# Patient Record
Sex: Female | Born: 1965 | Race: Black or African American | Hispanic: No | Marital: Single | Smoking: Never smoker
Health system: Southern US, Community
[De-identification: ages and names within clinical notes are randomized; demographics above are authoritative.]

## PROBLEM LIST (undated history)

## (undated) DIAGNOSIS — I10 Essential (primary) hypertension: Secondary | ICD-10-CM

## (undated) DIAGNOSIS — F32A Depression, unspecified: Secondary | ICD-10-CM

## (undated) DIAGNOSIS — E0590 Thyrotoxicosis, unspecified without thyrotoxic crisis or storm: Secondary | ICD-10-CM

## (undated) DIAGNOSIS — F419 Anxiety disorder, unspecified: Secondary | ICD-10-CM

## (undated) NOTE — Unmapped External Note (Signed)
 Formatting of this note might be different from the original. C/o left breast swelling , red and painful. No discharge. Here last night but left because it was too busy. Started last Monday. Electronically signed by Inocente Champ, RN at 07/02/2023  5:54 AM PDT

## (undated) NOTE — ED Provider Notes (Signed)
 Formatting of this note is different from the original. Images from the original note were not included. Emergency Department Encounter Name:   Shelley Cunningham MRN:   [id number]  DOB:   1965/02/13 Acct:   [id number]  Age:   70 y.o. Arrival:   07/02/23 at 0833  Sex:   female ED Provider:   Asberry LITTIE Greet, NP  PCP:  PCP, LENNOX, MD Location:   [facility] EMERGENCY   Chief Complaint Chief Complaint  Patient presents with   Breast Pain   HPI 42 y.o. female presents for left breast pain, redness, and swelling. The patient reports that the pain started on Monday, with the breast becoming increasingly red and swollen. She describes the pain as severe, interfering with her ability to sleep. The patient also reports experiencing fevers and chills associated with this condition.  The patient states that she has not had a mammogram recently, acknowledging that it's time for one. She denies being a current or former smoker. Ms. Lybrand reports a medical history significant for hypertension, asthma, and hypothyroidism.  Review of Systems General: Positive for fever, chills Breast: Left breast pain, redness, and swelling Respiratory: History of asthma Cardiovascular: History of hypertension Endocrine: History of hypothyroidism  Code Status:   Reviewed with patient and/or family as FULL  ROS Review of Systems   PHYSICAL EXAM BP 119/64   Pulse 83   Temp 98.4 F (36.9 C) (Oral)   Resp 18   Ht 5' 4 (1.626 m)   Wt 74.8 kg (165 lb)   SpO2 95%   BMI 28.32 kg/m  Vitals:   07/02/23 0855 07/02/23 0856 07/02/23 0857  BP:   119/64  Pulse:  83   Resp:  18   Temp:  98.4 F (36.9 C)   TempSrc:  Oral   SpO2:  95%   Weight: 74.8 kg (165 lb)    Height: 5' 4 (1.626 m)     Physical Exam Vitals reviewed.  Constitutional:      General: She is not in acute distress.    Appearance: Normal appearance. She is normal weight. She is not toxic-appearing.  HENT:     Mouth/Throat:      Mouth: Mucous membranes are moist.     Pharynx: Oropharynx is clear.  Eyes:     Extraocular Movements: Extraocular movements intact.     Conjunctiva/sclera: Conjunctivae normal.     Pupils: Pupils are equal, round, and reactive to light.  Cardiovascular:     Rate and Rhythm: Normal rate and regular rhythm.     Pulses: Normal pulses.     Heart sounds: Normal heart sounds.  Pulmonary:     Effort: Pulmonary effort is normal. No respiratory distress.     Breath sounds: Normal breath sounds. No wheezing.  Chest:   Abdominal:     General: Bowel sounds are normal.     Palpations: Abdomen is soft.  Musculoskeletal:        General: No swelling or tenderness. Normal range of motion.  Skin:    General: Skin is warm and dry.     Capillary Refill: Capillary refill takes less than 2 seconds.  Neurological:     General: No focal deficit present.     Mental Status: She is alert and oriented to person, place, and time. Mental status is at baseline.     Cranial Nerves: No cranial nerve deficit.     Motor: No weakness.  Psychiatric:        Mood and  Affect: Mood normal.        Behavior: Behavior normal.        Thought Content: Thought content normal.        Judgment: Judgment normal.   PAST MEDICAL HISTORY Past Medical History:  Diagnosis Date   Asthma    Hypertension    Hypothyroid    SURGICAL HISTORY No past surgical history on file.  CURRENT MEDICATIONS  Discharge Medication List as of 07/02/2023 10:54 AM    CONTINUE these medications which have NOT CHANGED   Details  methylPREDNISolone (MEDROL, PAK,) 4 MG tablet follow taper directions on package, Print     ALLERGIES Allergies  Allergen Reactions   Flagyl [Metronidazole]    FAMILY HISTORY No family history on file.  SOCIAL HISTORY Social History   Socioeconomic History   Marital status: Single    Spouse name: Not on file   Number of children: Not on file   Years of education: Not on file   Highest education  level: Not on file  Occupational History   Not on file  Tobacco Use   Smoking status: Not on file   Smokeless tobacco: Not on file  Substance and Sexual Activity   Alcohol use: Not on file   Drug use: Not on file   Sexual activity: Not on file  Other Topics Concern   Not on file  Social History Narrative   Not on file   Social Determinants of Health   Financial Resource Strain: Not on file  Food Insecurity: Not on file  Transportation Needs: Not on file  Physical Activity: Not on file  Stress: Not on file  Social Connections: Not on file  Intimate Partner Violence: Not on file  Housing Stability: Not on file   Labs Labs Reviewed - No data to display  Radiology Imaging Results   None   ------------------------------------------------------------------------------------------- MEDICAL DECISION MAKING (MDM) AND ED COURSE  Number and Complexity of Problems = 2 or more with high complexity -Differential Diagnosis: Mastitis, cellulitis, abscess  -Patient's chronic illnesses impacting care: See PMHx, PSHx   Data -Diagnostic test ordered: No orders of the defined types were placed in this encounter.  -Diagnostic test review: (abnormal) consistent with diagnosis -My EKG interpretation: Not Applicable   -My Personal X-ray interpretation:  consistent with diagnosis -Decision rules/scores evaluated: Not Applicable -Diagnostic Tests considered but not ordered: None -External documents reviewed: Yes -History obtained from someone other than the patient: Yes -Discussed with: Patient & Family   Treatment and Disposition Medication  Reviewed : Yes  Medications Given in ED: Medications  sulfamethoxazole-trimethoprim (BACTRIM DS,SEPTRA DS) 800-160 MG per tablet 2 tablet (2 tablets Oral Given 07/02/23 1054)  HYDROcodone-acetaminophen  (NORCO) 5-325 MG per tablet 1 tablet (1 tablet Oral Given 07/02/23 1052)   Prescription Drug Management  Prescriptions: Discharge Medication List  as of 07/02/2023 10:54 AM    START taking these medications   Details  sulfamethoxazole-trimethoprim (BACTRIM DS,SEPTRA DS) 800-160 MG per tablet Take 1 tablet by mouth 2 (two) times a day for 10 days, Starting Wed 07/02/2023, Until Sat 07/12/2023, Normal   traMADol  (ULTRAM ) 50 MG tablet Take 1 tablet (50 mg total) by mouth daily for 10 days, Starting Wed 07/02/2023, Until Sat 07/12/2023, Normal     Social Determinants of Health that impact treatment or disposition: None  Shared decision making: N/A  ED Course: ?Ms. Widjaja, a female with hypertension, asthma, and hypothyroidism, presented with left breast pain, redness, and swelling that began Monday, accompanied by fever and  chills. Examination revealed an erythematous, swollen left breast. She was diagnosed with mastitis/breast cellulitis and prescribed Bactrim for infection and tramadol  for pain management with first dose administered in the ED. She was referred to a breast specialist and scheduled for a mammogram with follow-up to assess antibiotic response.   Please see MDM section and rest of the note for further information on patient assessment and treatment.  Nursing Notes: Reviewed and utilized the nursing notes.  Old Medical Records:  The patient?s available past medical records and past encounters were reviewed and utilized.   ------------------------------------------------------------------------------------------------------------------ FINAL IMPRESSION AND DISPOSITION  1. Cellulitis of chest wall    Plan/Orders Assosiated with Diagnosis  and ICD Codes    Diagnosis Name ICD-10-CM Plan/Orders Assosiated with Diagnosis  1. Cellulitis of chest wall  L03.313      DISPOSITION:  Discharge to home  Shared Decision-Making:  Treatment plan and disposition discussed with the patient/family, questions answered   PATIENT REFERRED TO:  Follow-up Information     [facility]. Schedule an appointment as soon  as possible for a visit in 1 week.   Contact information [address] [place]  [phone]  Hours: M-F 8:30-4:30 Closed Weekends      Sal VEAR Novak, MD. Schedule an appointment as soon as possible for a visit in 1 week.   Specialty: Obstetrics and Gynecology Contact information [address] [place] [address] [phone]          DISCHARGE MEDICATIONS: Discharge Medication List as of 07/02/2023 10:54 AM    START taking these medications   Details  sulfamethoxazole-trimethoprim (BACTRIM DS,SEPTRA DS) 800-160 MG per tablet Take 1 tablet by mouth 2 (two) times a day for 10 days, Starting Wed 07/02/2023, Until Sat 07/12/2023, Normal   traMADol  (ULTRAM ) 50 MG tablet Take 1 tablet (50 mg total) by mouth daily for 10 days, Starting Wed 07/02/2023, Until Sat 07/12/2023, Normal      Asberry LITTIE Greet, NP 07/03/23 1314  Electronically signed by Leisa JAYSON [place], MD at 07/03/2023 12:18 PM PDT  Associated attestation - [place] Leisa JAYSON, MD - 07/03/2023 12:18 PM PDT Formatting of this note might be different from the original. This  patient  was seen independently by the midlevel provider. I was available for consult however I was not involved in the decision making or the disposition of this patient.

---

## 2005-11-01 ENCOUNTER — Emergency Department (HOSPITAL_COMMUNITY): Admission: EM | Admit: 2005-11-01 | Discharge: 2005-11-01 | Payer: Self-pay | Admitting: Family Medicine

## 2005-11-05 ENCOUNTER — Ambulatory Visit: Payer: Self-pay | Admitting: *Deleted

## 2006-08-04 ENCOUNTER — Emergency Department (HOSPITAL_COMMUNITY): Admission: EM | Admit: 2006-08-04 | Discharge: 2006-08-04 | Payer: Self-pay | Admitting: Emergency Medicine
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
## 2006-11-24 ENCOUNTER — Emergency Department (HOSPITAL_COMMUNITY): Admission: EM | Admit: 2006-11-24 | Discharge: 2006-11-24 | Payer: Self-pay | Admitting: Emergency Medicine

## 2006-11-24 IMAGING — CR DG CHEST 2V
2 series · 2 of 2 positions shown · non-contrast
Comparison: None.

CLINICAL DATA: Chest pain.
 CHEST - 2 VIEW:

[w chest pa]
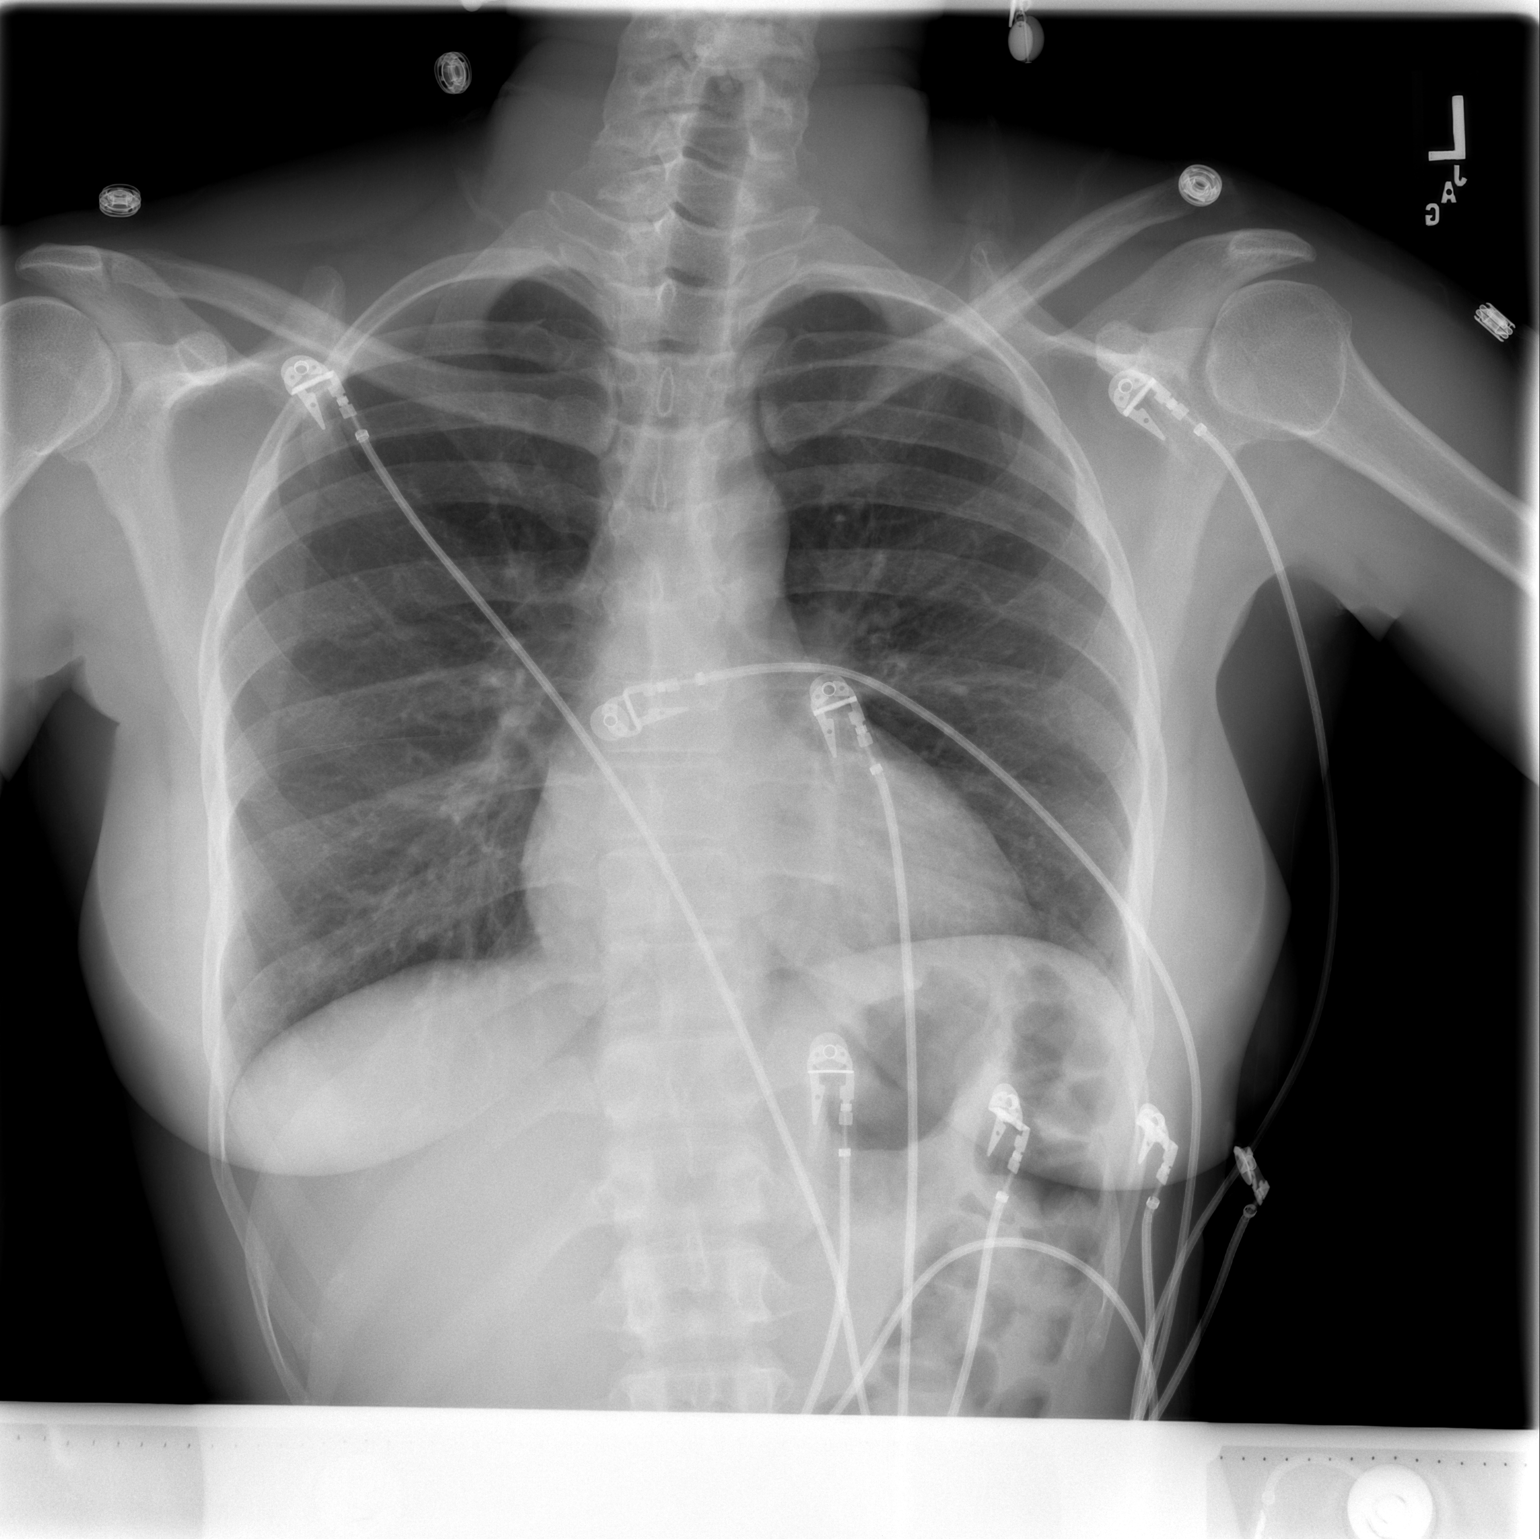

[w chest lat]
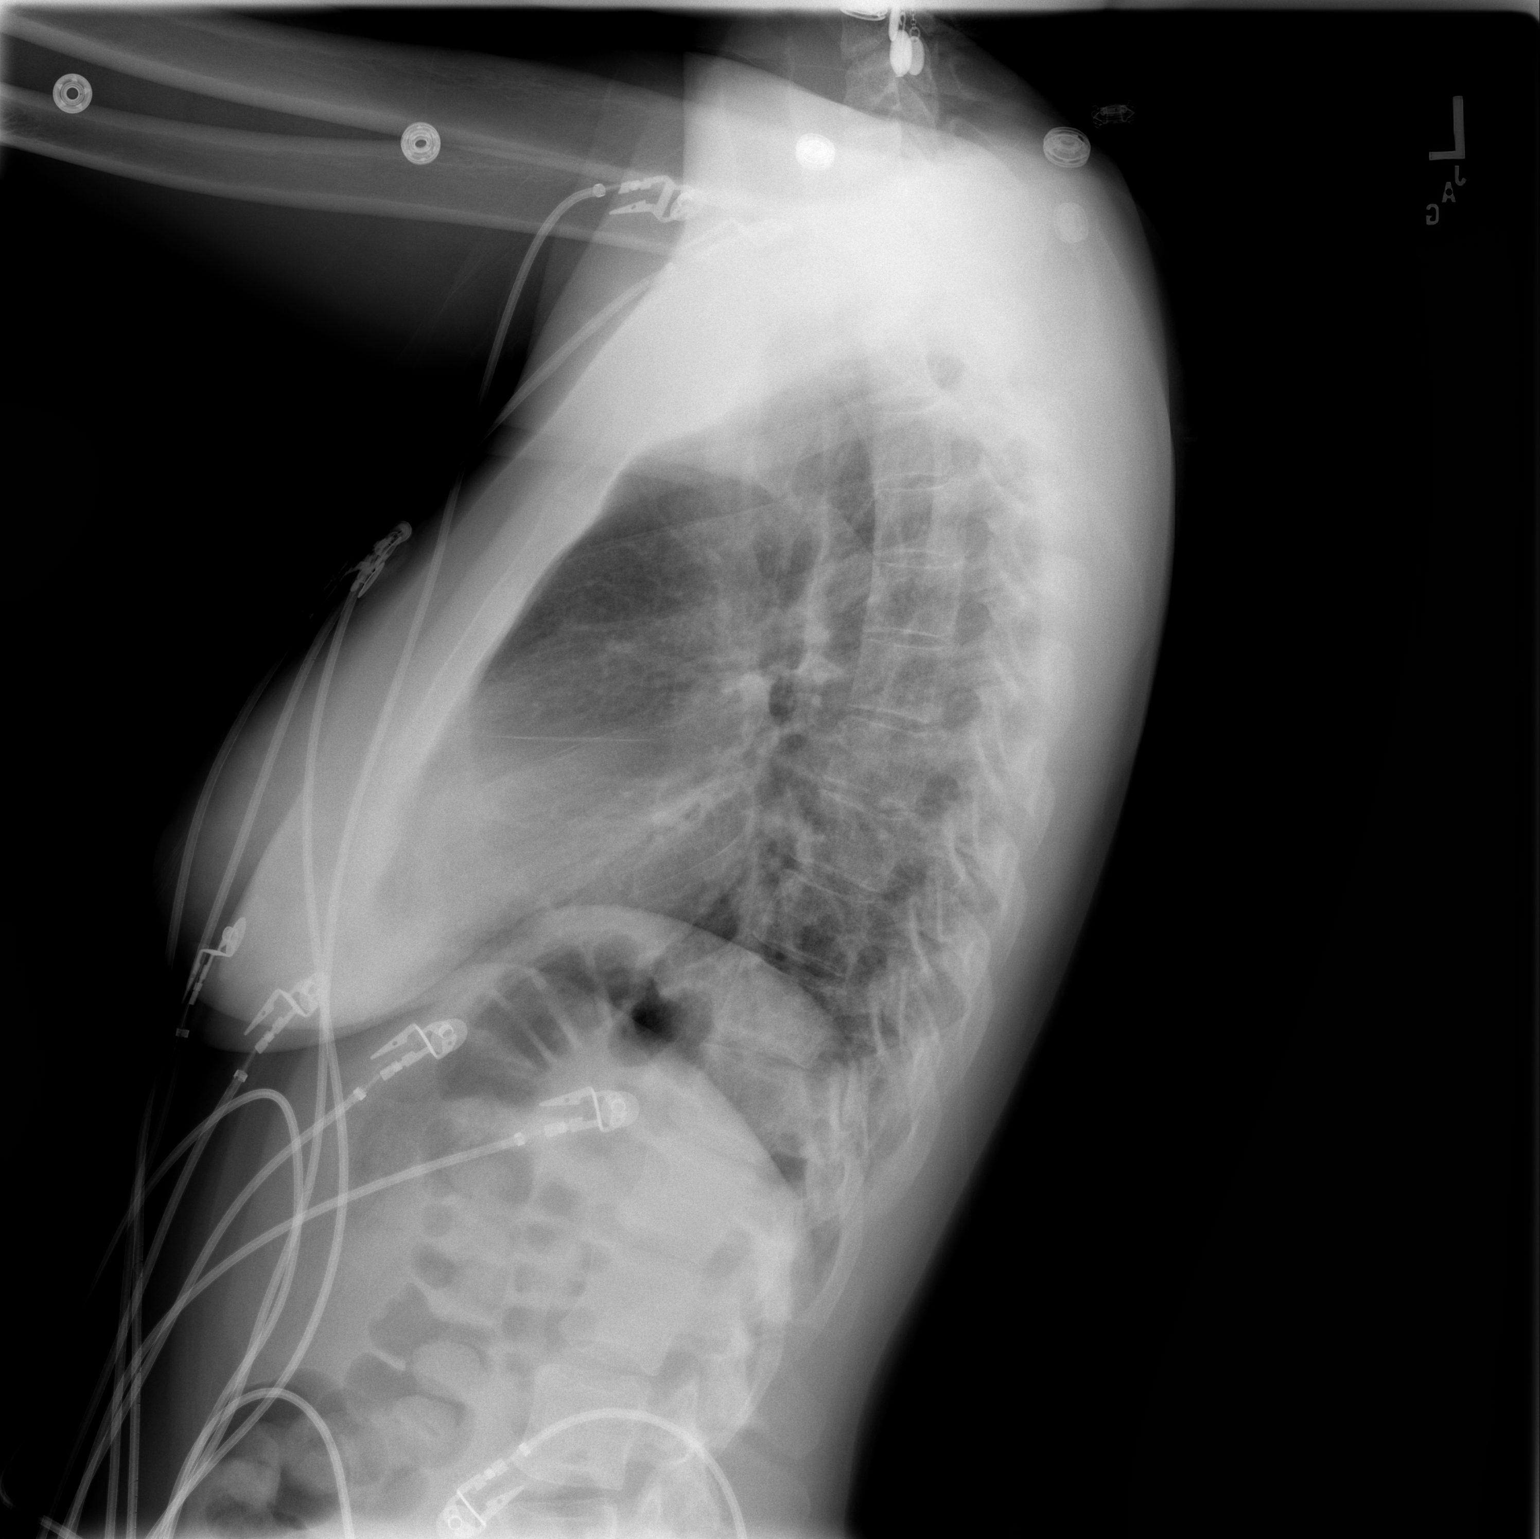

[2 of 2 positions shown; findings below may reference images not displayed]

FINDINGS: Lungs are clear.  No infiltrates, edema or pleural fluid.  No pneumothorax is seen.  The heart size is normal.  No pleural effusions.  The bony thorax is normal.
IMPRESSION: No acute findings.

## 2007-02-25 ENCOUNTER — Emergency Department (HOSPITAL_COMMUNITY): Admission: EM | Admit: 2007-02-25 | Discharge: 2007-02-25 | Payer: Self-pay | Admitting: Emergency Medicine

## 2007-02-25 IMAGING — CR DG CLAVICLE*L*
2 series · 2 of 2 positions shown · non-contrast
Comparison: [DATE].

CLINICAL DATA: LEFT CLAVICLE ? 2 VIEW:

[w clavicle ap left]
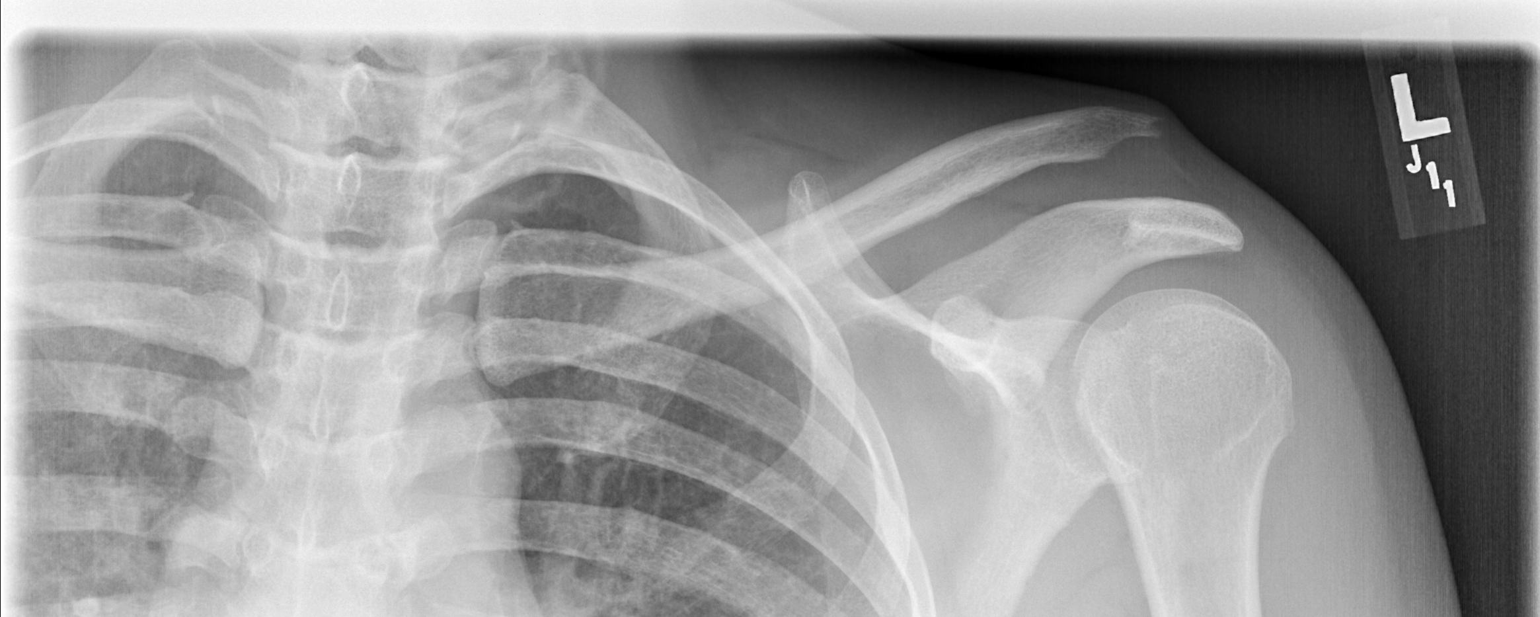

[w clavicle tangential left]
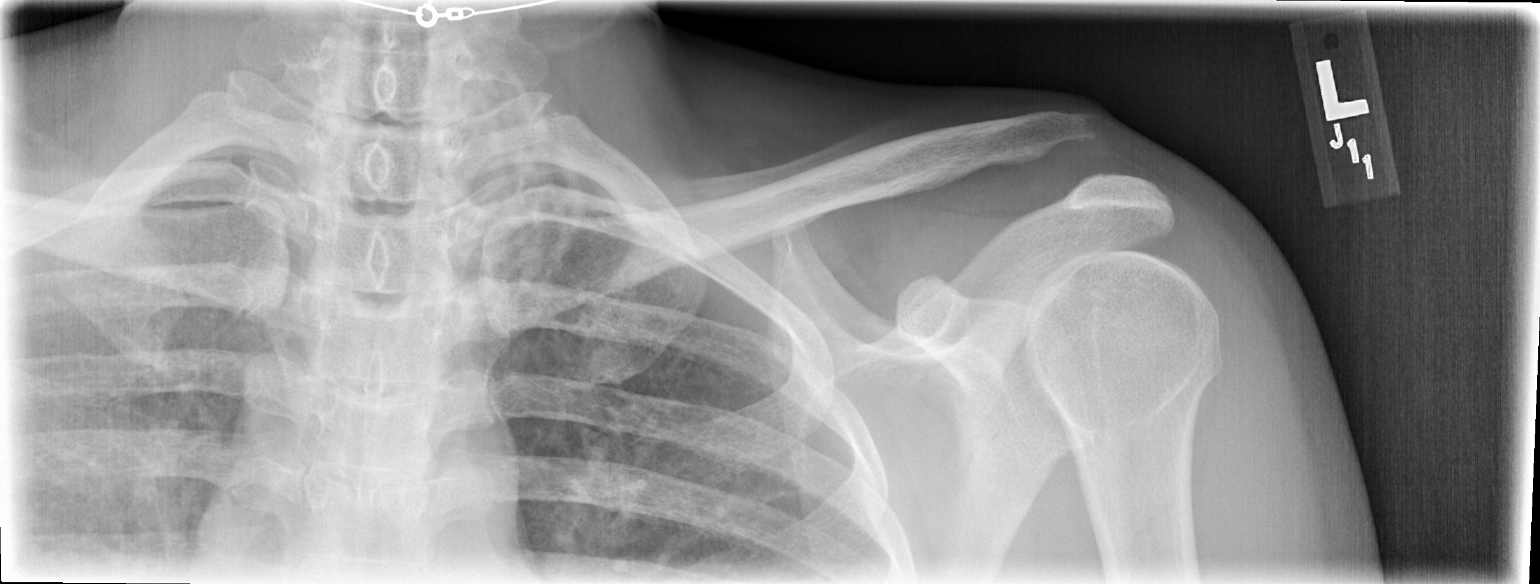

[2 of 2 positions shown; findings below may reference images not displayed]

FINDINGS: Two views of the left clavicle show no acute fracture.  There is chronic left acromioclavicular separation with cephalad position of distal clavicle.  There is volume loss/small osteolysis at the distal aspect of the left clavicle.   No acute fracture is seen.
IMPRESSION: No acute fracture is noted. There is chronic left acromioclavicular separation.  There is volume loss/osteolysis at the distal aspect of left clavicle.

## 2007-03-18 ENCOUNTER — Ambulatory Visit (HOSPITAL_BASED_OUTPATIENT_CLINIC_OR_DEPARTMENT_OTHER): Admission: RE | Admit: 2007-03-18 | Discharge: 2007-03-18 | Payer: Self-pay | Admitting: Orthopedic Surgery

## 2007-04-30 ENCOUNTER — Encounter: Admission: RE | Admit: 2007-04-30 | Discharge: 2007-07-29 | Payer: Self-pay | Admitting: Orthopedic Surgery

## 2007-06-14 ENCOUNTER — Emergency Department (HOSPITAL_COMMUNITY): Admission: EM | Admit: 2007-06-14 | Discharge: 2007-06-14 | Payer: Self-pay | Admitting: Emergency Medicine

## 2007-06-27 ENCOUNTER — Inpatient Hospital Stay (HOSPITAL_COMMUNITY): Admission: EM | Admit: 2007-06-27 | Discharge: 2007-07-01 | Payer: Self-pay | Admitting: Emergency Medicine

## 2007-06-27 IMAGING — CT CT PELVIS W/O CM
2 of 4 series · 16 of 46 positions shown, 18 images · non-contrast
Comparison: None

CT ABDOMEN

CLINICAL DATA: Hematuria with pain below the umbilicus.  Abdominal
cramps

CT ABDOMEN AND PELVIS WITHOUT CONTRAST
TECHNIQUE: Multidetector CT imaging of the abdomen and pelvis was
performed following the standard
protocol without intravenous contrast.

[Series 2: <(id) w/o a/p >(id) · axial · non-contrast · 0.65mm/px · z∈[-389,-49]mm · 13 of 74 slices shown, 15 images]
[im 3/74  soft-tissue]
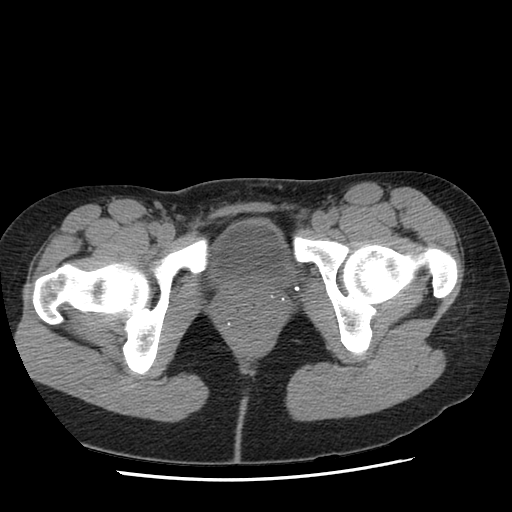
[im 3/74  bone]
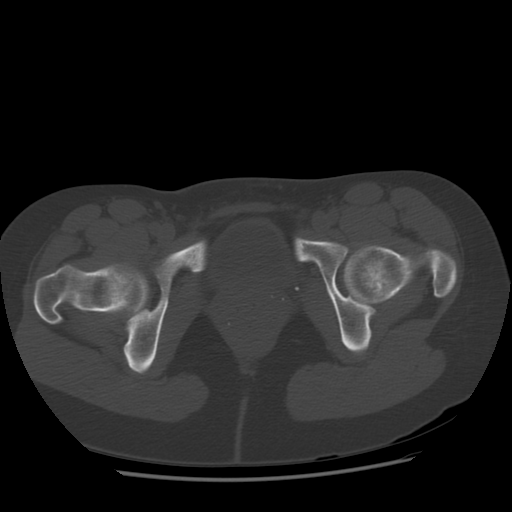
[im 9/74  soft-tissue]
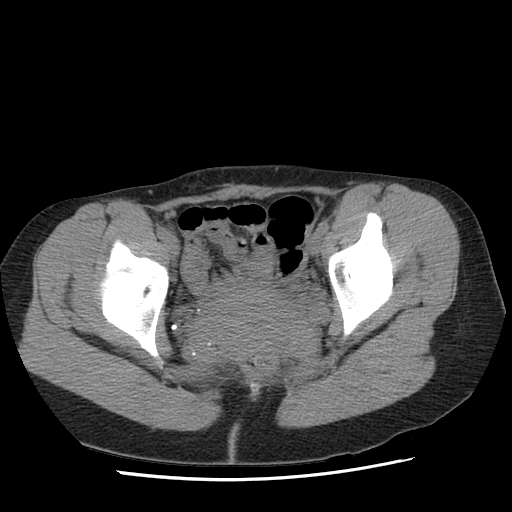
[im 15/74  soft-tissue]
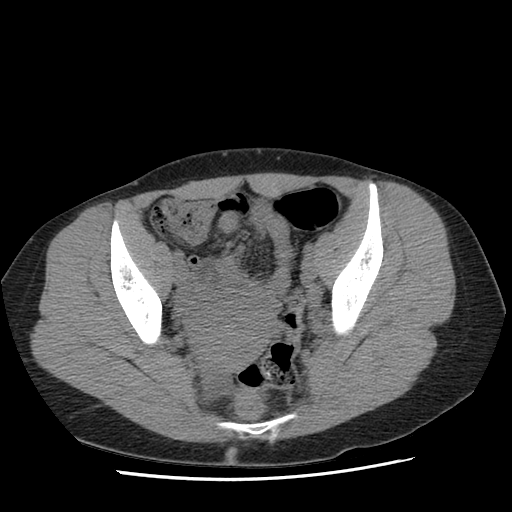
[im 21/74  soft-tissue]
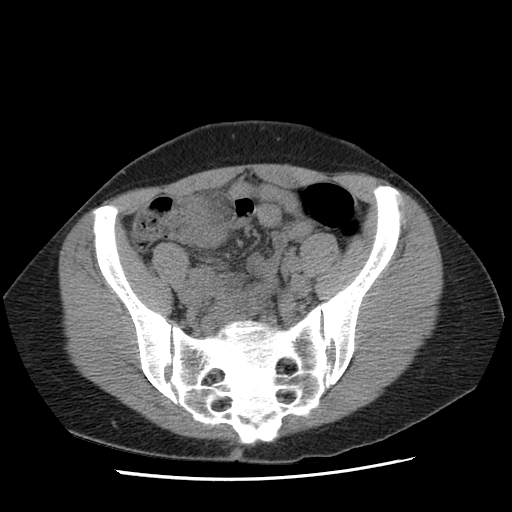
[im 27/74  soft-tissue]
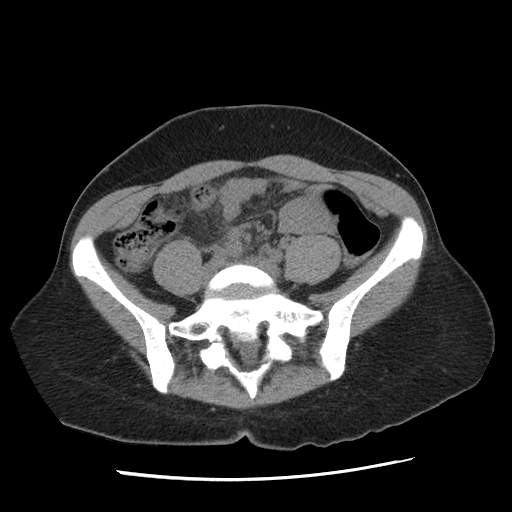
[im 33/74  soft-tissue]
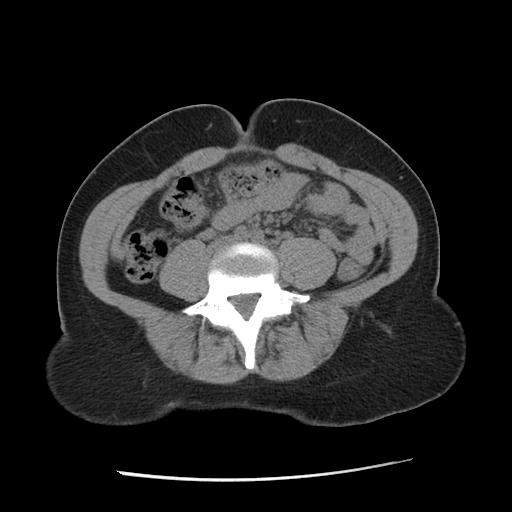
[im 38/74  soft-tissue]
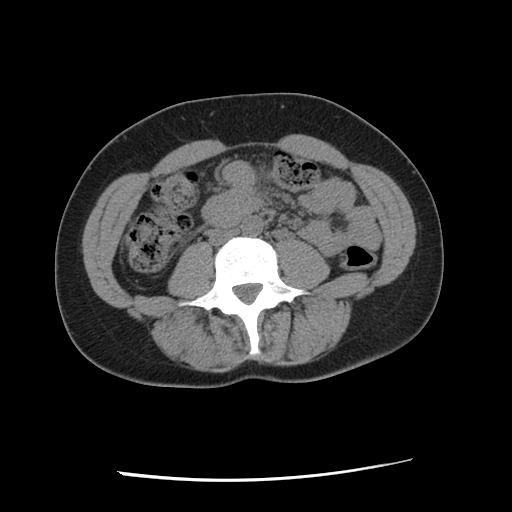
[im 41/74  soft-tissue]
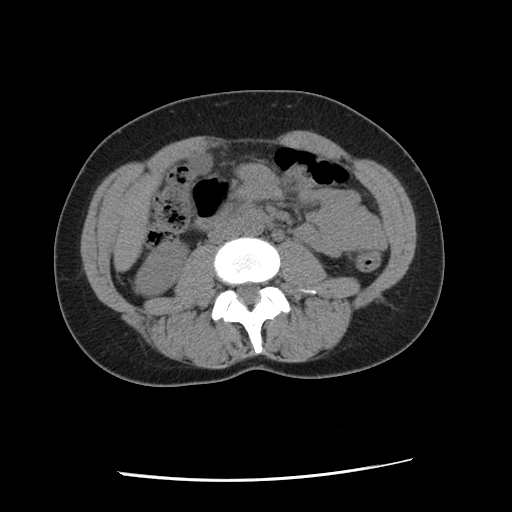
[im 47/74  soft-tissue]
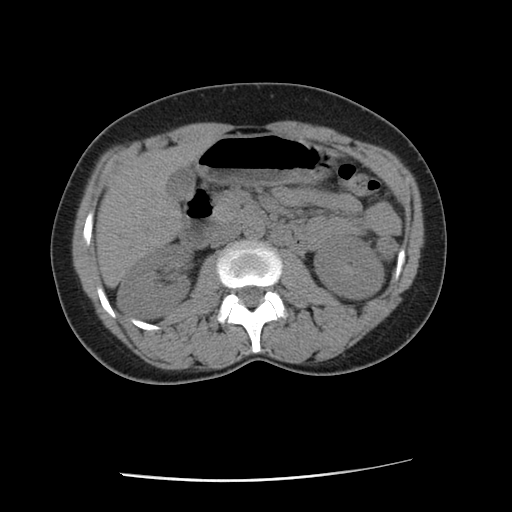
[im 47/74  bone]
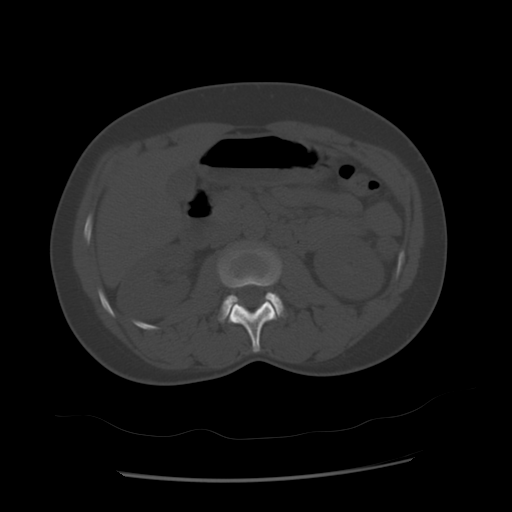
[im 53/74  soft-tissue]
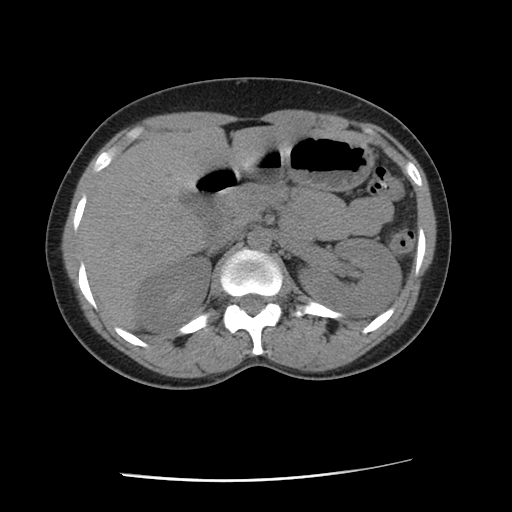
[im 59/74  soft-tissue]
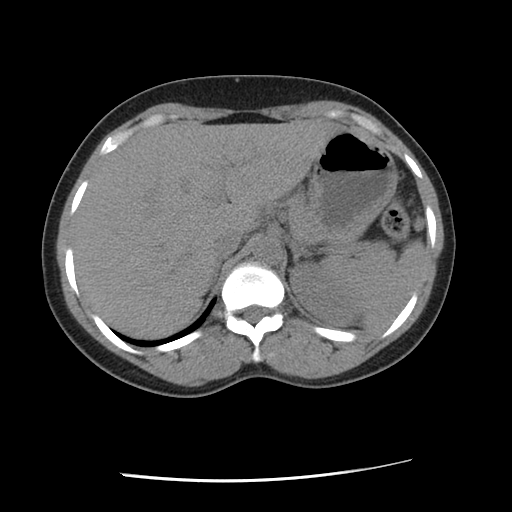
[im 65/74  soft-tissue]
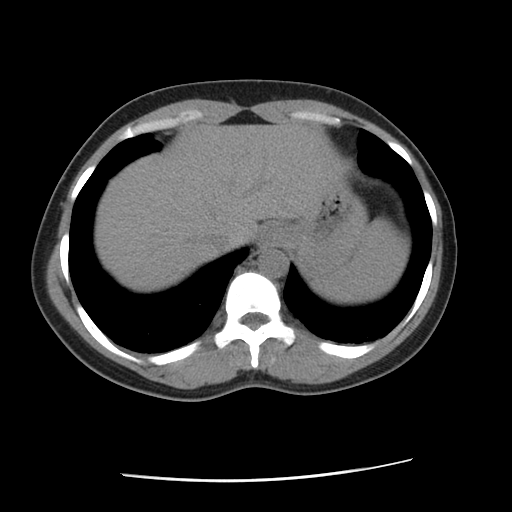
[im 71/74  soft-tissue]
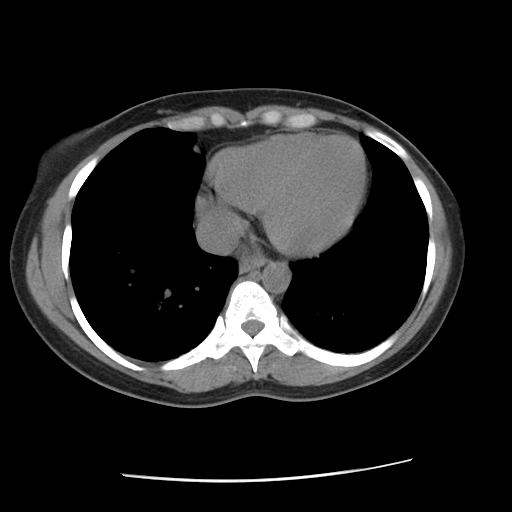

[Series 400: reformatted · coronal · 0.80mm/px · 3 of 65 slices shown]
[im 22/65  soft-tissue]
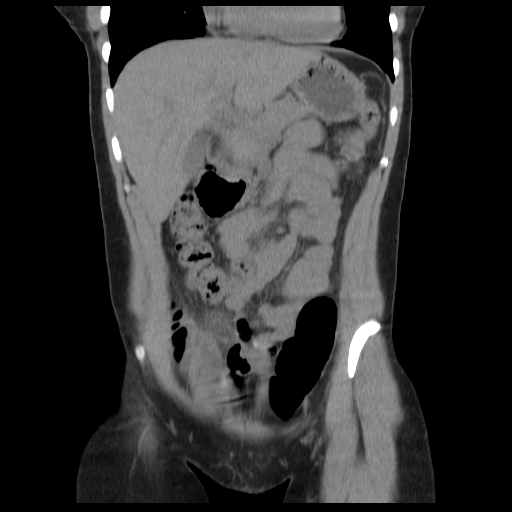
[im 29/65  soft-tissue]
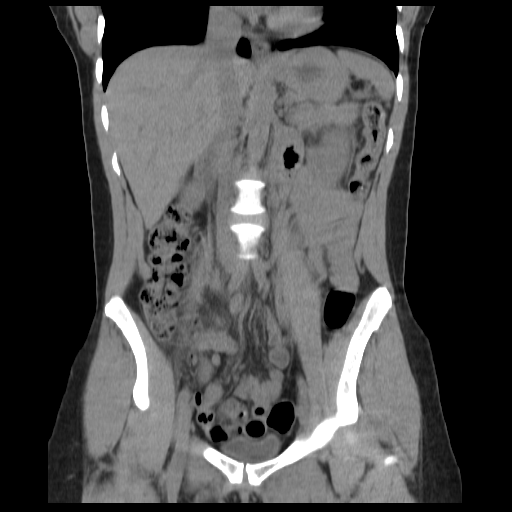
[im 36/65  soft-tissue]
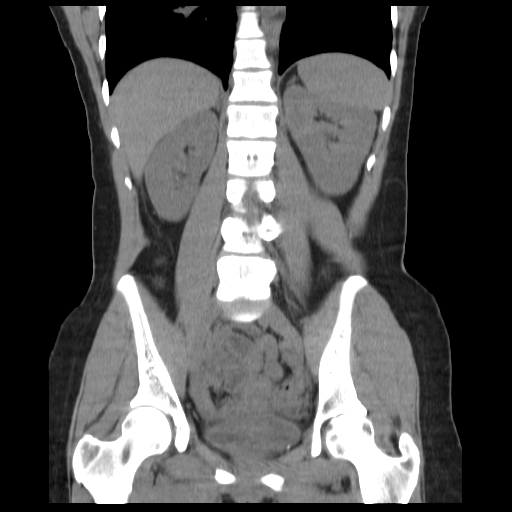

[16 of 46 positions shown; findings below may reference images not displayed]

FINDINGS: The liver and spleen have normal uninfused features.
The stomach is unremarkable.  The patient has a large duodenal
diverticulum.  The pancreas has normal uninfused features.  The
gallbladder and adrenal glands are unremarkable.  No stone is
identified in either kidney.  Some hyper attenuation in scattered
papillae of the kidneys may be related to concentrated urine.

There is no abdominal aortic aneurysm.  No abdominal
lymphadenopathy.  No intraperitoneal free fluid.
IMPRESSION: No acute findings in the abdomen.

CT PELVIS
FINDINGS: A small amount of intraperitoneal free fluid is evident.
The there is no pelvic sidewall lymphadenopathy.  Uterus is poorly
delineated.  Neither ovary is discretely identified. No stones are
seen in the bladder or either ureter.

There is an inflammatory process in the right lower quadrant which
is somewhat difficult to discern given the lack of oral and
intravenous contrast.  This appears to involve the cecum near the
ileocecal valve.  The appendix is normal with air in the lumen and
a diameter ranging from 5-7 mm.  The terminal ileum is not well
defined, but does track through or adjacent to the area of
inflammation.  Several tiny locules of extraluminal gas are seen in
the inflammatory change.
IMPRESSION: Inflammatory change with minimal extraluminal gas is identified in
the right lower quadrant, in the region of the cecum/ileocecal
valve.  The lack of oral and intravenous contrast hinders
assessment, but this does not appear to be related to the appendix.
Terminal ileitis with microperforation would be a consideration.
Right-sided diverticulitis with a trace amount of extraluminal gas
can also have this appearance.  PALACE diverticulitis and colonic
neoplasm cannot be completely excluded, but considered less likely
possibilities.  No focal or drainable fluid collection is evident
although assessment is limited by the noncontrast nature of this
exam.

Critical test results telephoned by me to Dr. PALACE at the time of
interpretation on date [DATE] at time [DATE] a.m..

## 2007-06-27 IMAGING — CT CT ABDOMEN W/ CM
1 of 5 series · 13 of 36 positions shown, 18 images · IV contrast (agent unspecified)
Comparison: Unenhanced CT same day

CLINICAL DATA: Abdominal pain

CT ABDOMEN WITH CONTRAST,CT PELVIS WITH CONTRAST
TECHNIQUE: Multidetector CT imaging of the abdomen was performed
following the standard protocol during bolus administration of
intravenous contrast.,Technique:  Multidetector CT imaging of the
pelvis was performed following the standard protocol during
Contrast: 100 ml of Optiray 350

[Series 2: routine abdomen · axial · 0.65mm/px · z∈[-397,-37]mm · 13 of 80 slices shown, 18 images]
[im 4/80  soft-tissue]
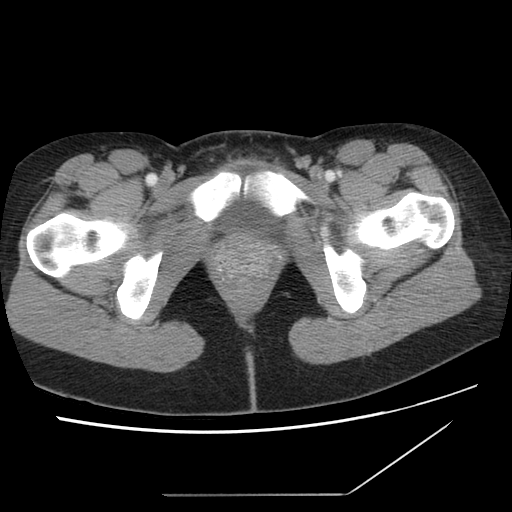
[im 4/80  bone]
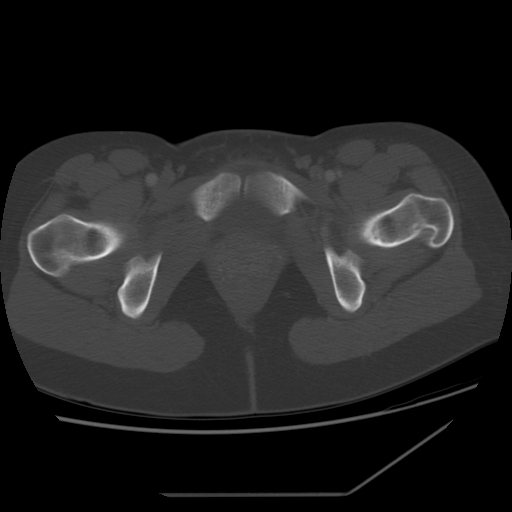
[im 12/80  soft-tissue]
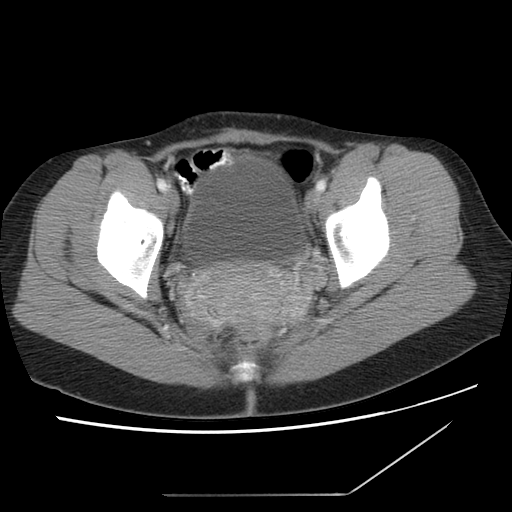
[im 16/80  soft-tissue]
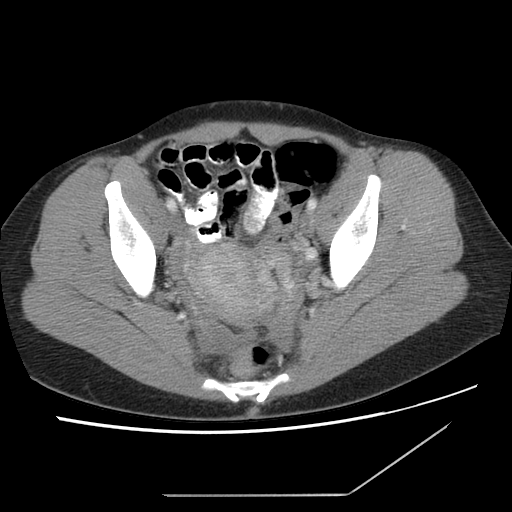
[im 24/80  soft-tissue]
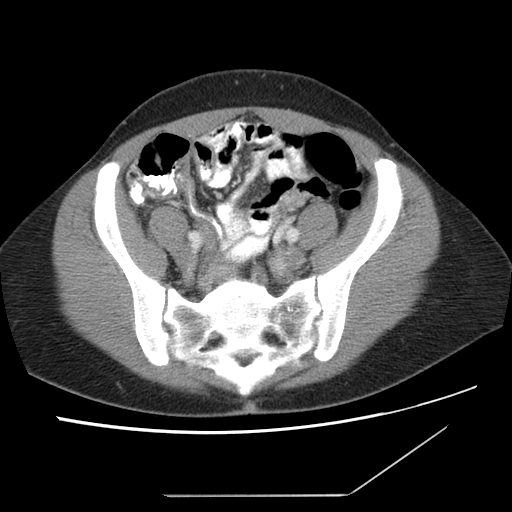
[im 32/80  soft-tissue]
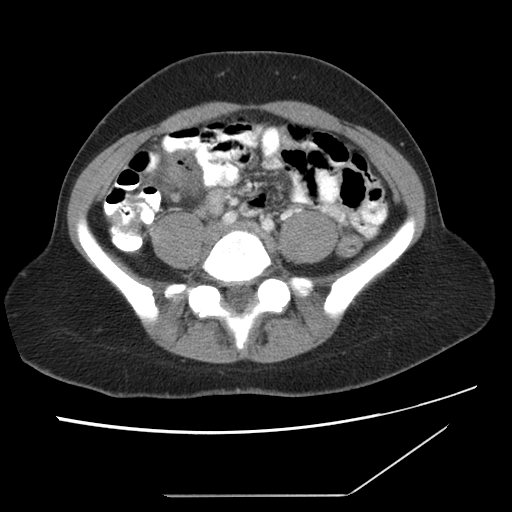
[im 36/80  soft-tissue]
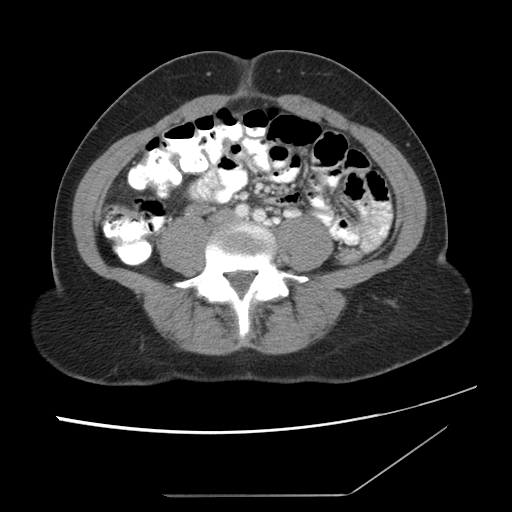
[im 44/80  soft-tissue]
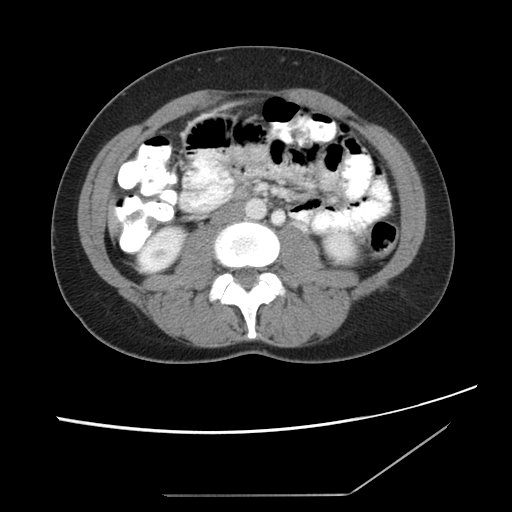
[im 48/80  soft-tissue]
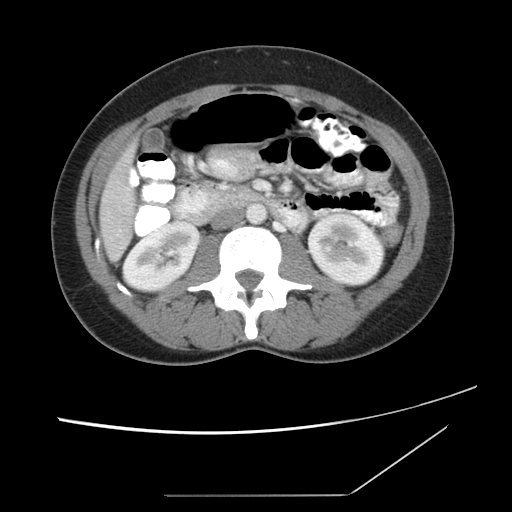
[im 56/80  soft-tissue]
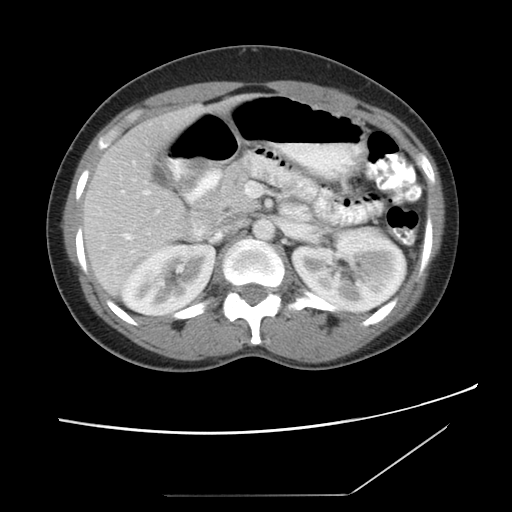
[im 56/80  bone]
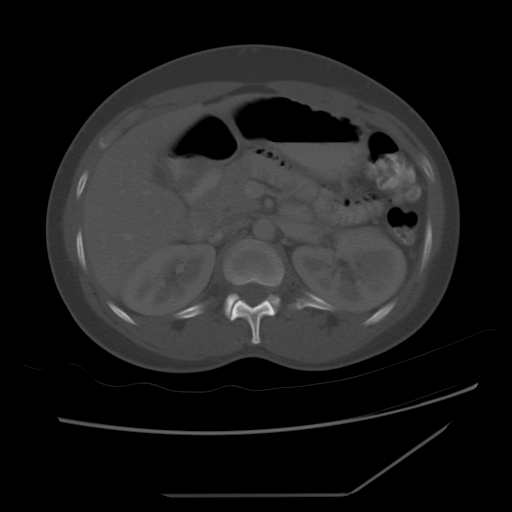
[im 64/80  soft-tissue]
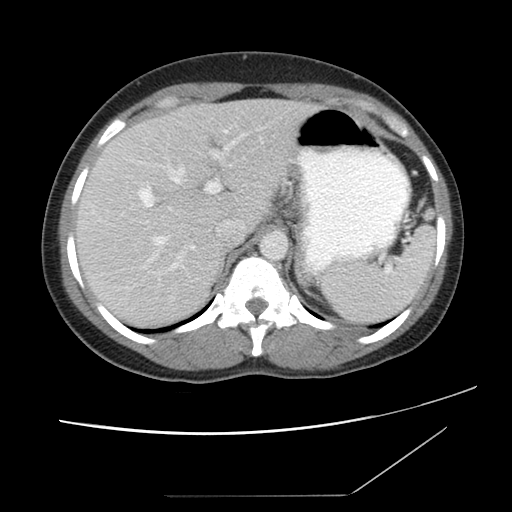
[im 64/80  lung]
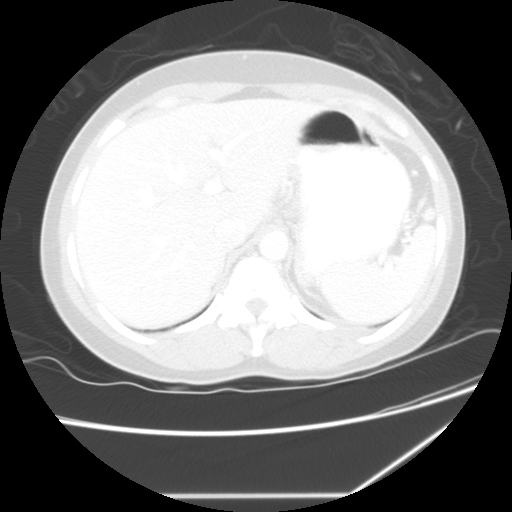
[im 68/80  soft-tissue]
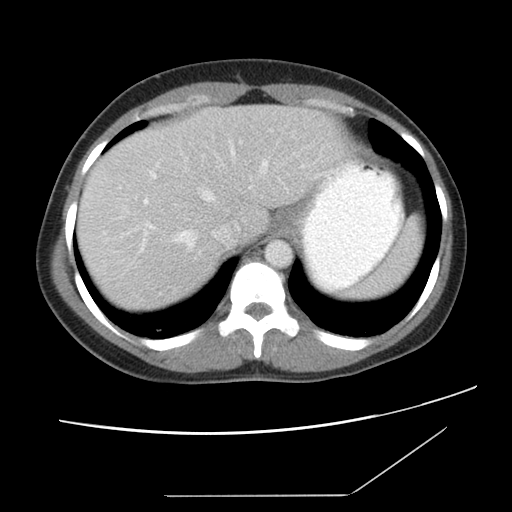
[im 68/80  lung]
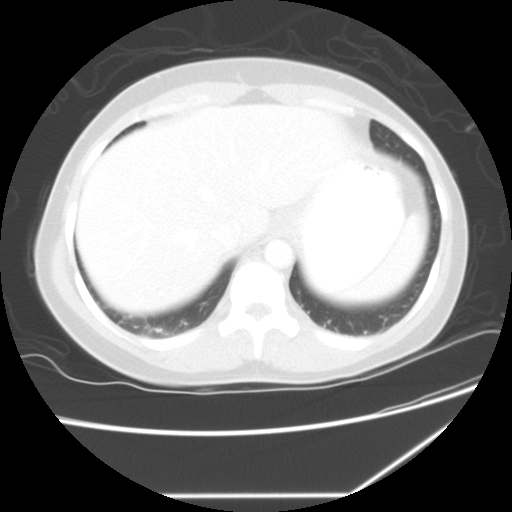
[im 72/80  lung]
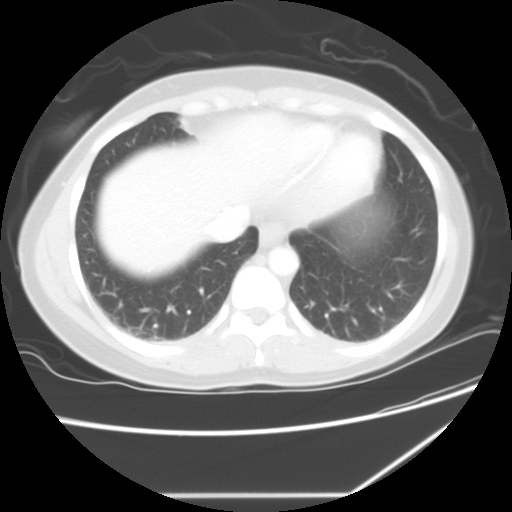
[im 76/80  soft-tissue]
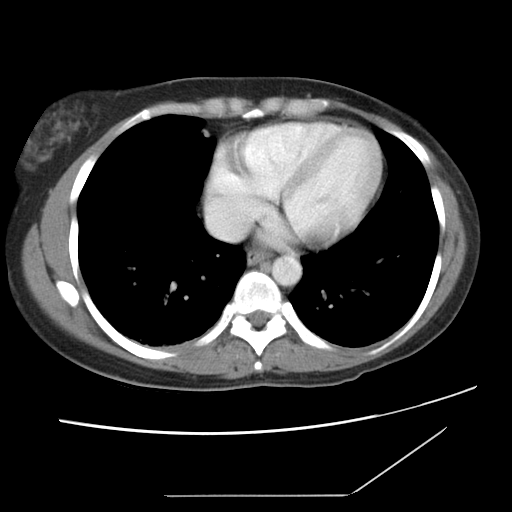
[im 76/80  lung]
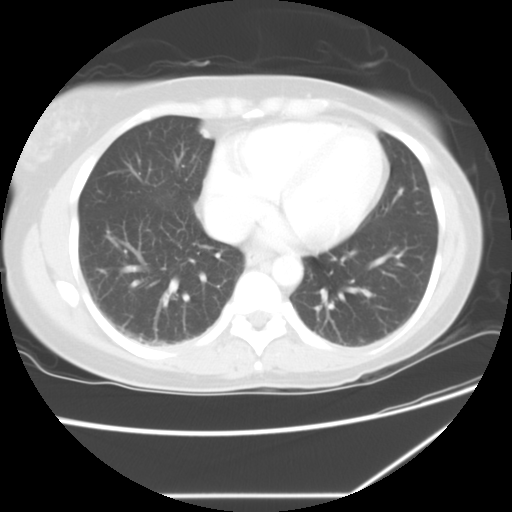

[13 of 36 positions shown; findings below may reference images not displayed]

FINDINGS: The visualized lung bases are unremarkable. The gallbladder is
contracted without evidence of gallstones.  Liver, spleen,
pancreas, kidneys and adrenals are unremarkable.  No bowel
obstruction.  No ascites or free air.  No adenopathy. No
destructive bony lesions are seen.
IMPRESSION: 1.  No acute inflammatory process within abdomen.
2.  No bowel obstruction.  No ascites or free air.

Findings for the pelvis:

Again identified a inflammatory process in the right lower quadrant
which is thick-walled.  A small amount of extra colonic gas is
noted within this collection this process measures about to 3.3 x
2.3 cm.  On coronal reconstructed images oral contrast material is
noted to enter a diverticulum in this region.  Given close
proximity with the ileocecal valve findings are highly suspicious
for inflamed MALKA diverticulum with a tiny locules of
extraluminal gas.  Similar this may represent acute diverticulitis
or abscess secondary to terminal ileitis.  Clinical correlation is
necessary. Two small lymph nodes are seen in the right lower
quadrant mesentery. Normal appendix is partially visualized. Small
amount of pelvic free fluid is noted.  The uterus is unremarkable.
The adnexa are unremarkable.  The urinary bladder is unremarkable.

Impression

1.  There is inflammatory process in the right lower quadrant
measures about 3.3 x 2.3 cm.  Small amount of extraluminal air is
noted.  This processes medial to the cecum anterior to the ileal
cecal valve and terminal ileum.  Oral contrast material is noted to
enter a diverticulum in this region.  Small amount of extraluminal
air is noted.  Findings are highly suspicious for inflamed MALKA
diverticulum with a small amount of extraluminal air/abscess.
Similar may represent a acute diverticulitis or mesenteric abscess
due to terminal ileitis.. At least two small lymph nodes are seen
in this region.  One measures five mm the second more anteriorly
measures 6 mm.
2.  Normal appendix is partially visualized.  Small amount of
pelvic free fluid is noted.

Discussed with Dr.MALKA at 16 p.m. in  [DATE]

## 2007-06-28 IMAGING — CT CT GUIDANCE NEEDLE PLACEMENT
1 series · 15 of 32 positions shown, 19 images · non-contrast
Comparison: none

Clinical Data/Indication: ABSCESS. . RIGHT LOWER QUADRANT ABSCESS

Right lower quadrant intraperitoneal fluid collection aspiration
Sedation: Versed to mg, Fentanyl 50 mg.
Total Moderate Sedation Time: 15 minutes.
Contrast Volume: None.
Additional Medications: None.
Fluoroscopy Time: Zero minutes.
Procedure: The procedure, risks, benefits, and alternatives were
explained to the patient. Questions regarding the procedure were
encouraged and answered. The patient understands and consents to
the procedure.
The right lower quadrant was prepped withbetadine in a sterile
fasion, and a sterile drape was applied covering the operative
field. A sterile gown and sterile gloves were used for the
procedure.
An 18 gauge needle was inserted into the fluid collection.  1 ml of
pus was aspirated.  The needle was removed.

[Series 2: abd pelvis · axial · 0.70mm/px · z∈[-146,-86]mm · 15 of 60 slices shown, 19 images]
[im 4/60  soft-tissue]
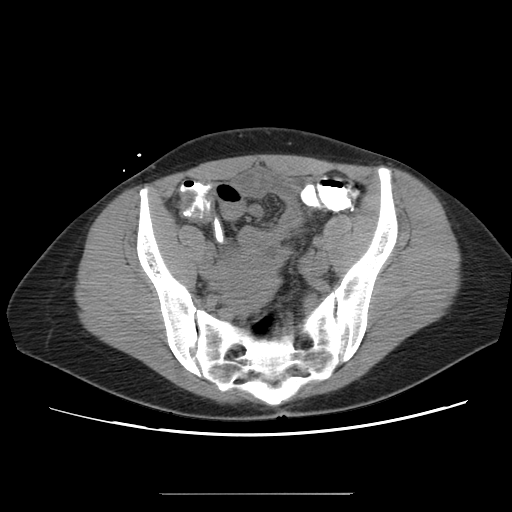
[im 4/60  bone]
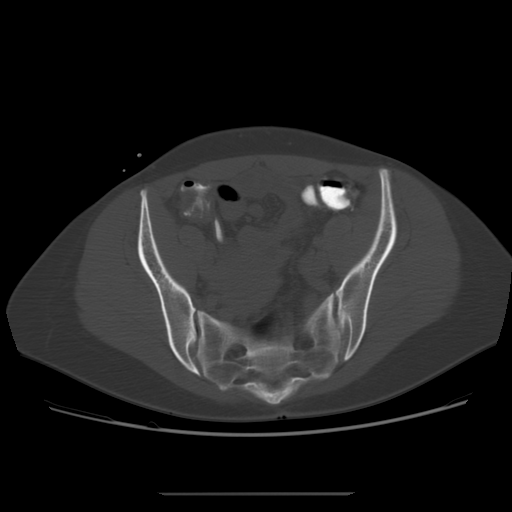
[im 8/60  soft-tissue]
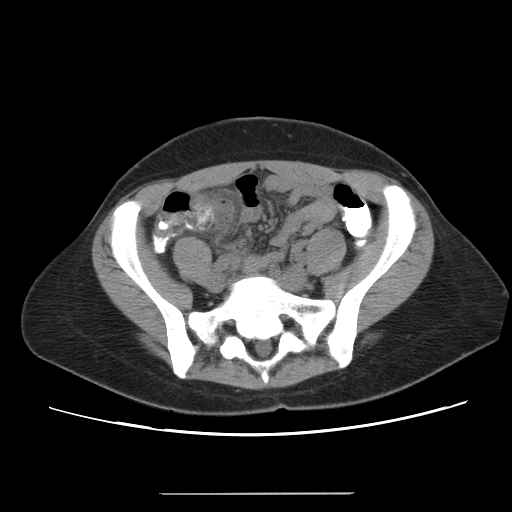
[im 12/60  soft-tissue]
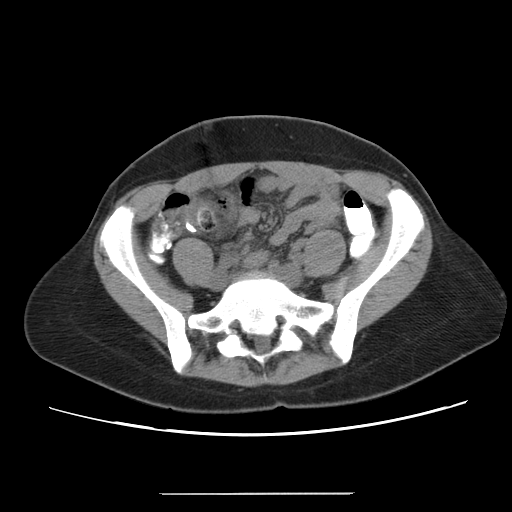
[im 18/60  soft-tissue]
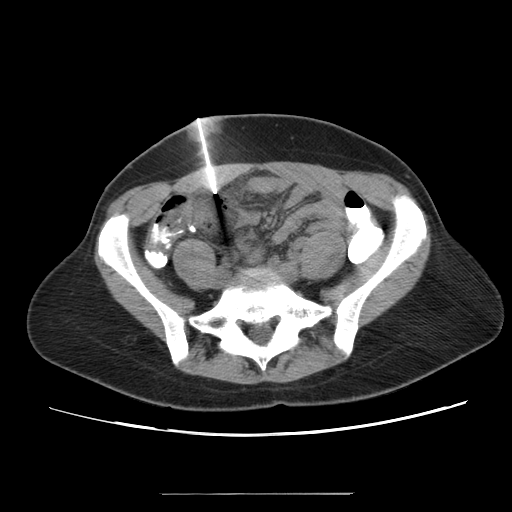
[im 21/60  soft-tissue]
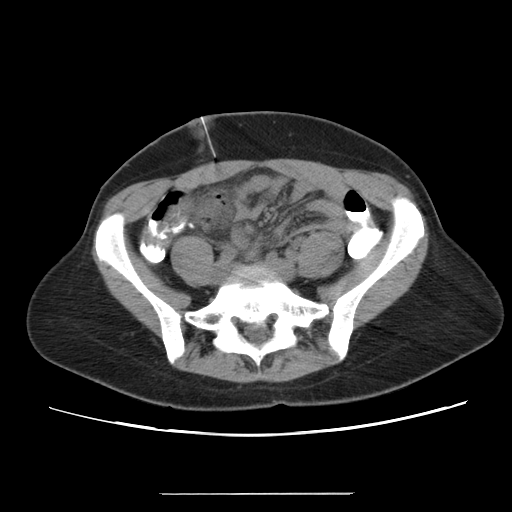
[im 25/60  soft-tissue]
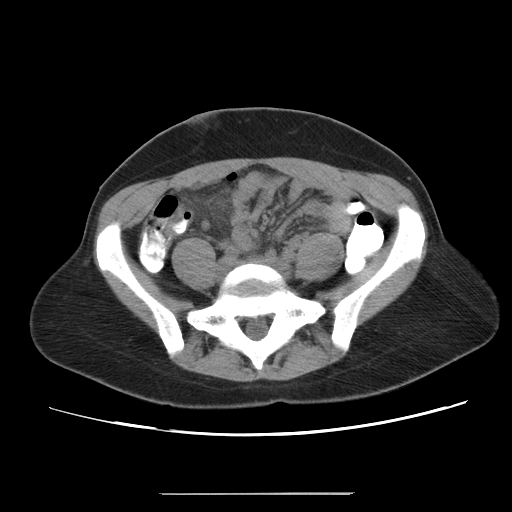
[im 31/60  soft-tissue]
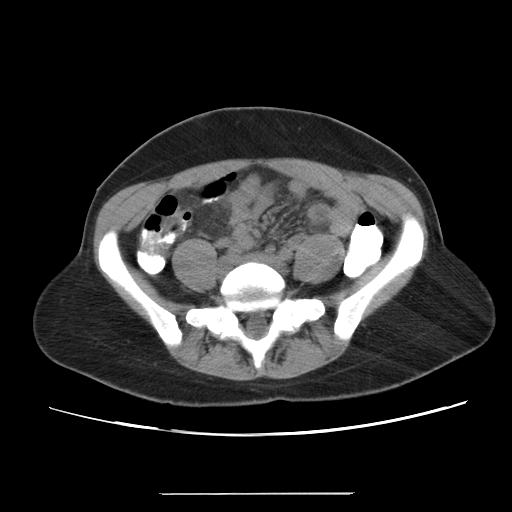
[im 35/60  soft-tissue]
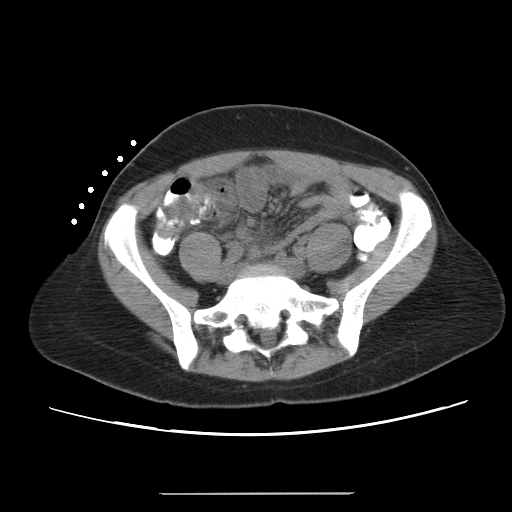
[im 39/60  soft-tissue]
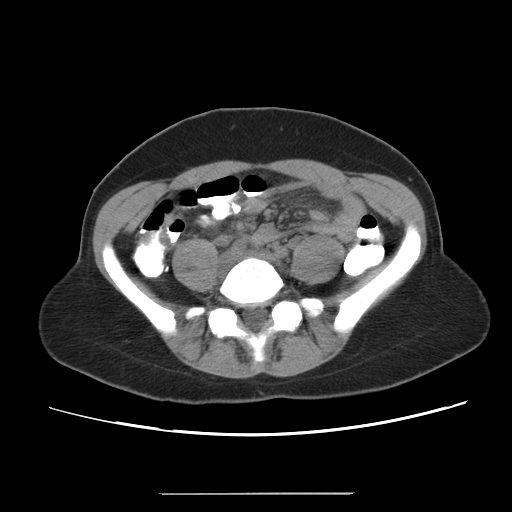
[im 39/60  bone]
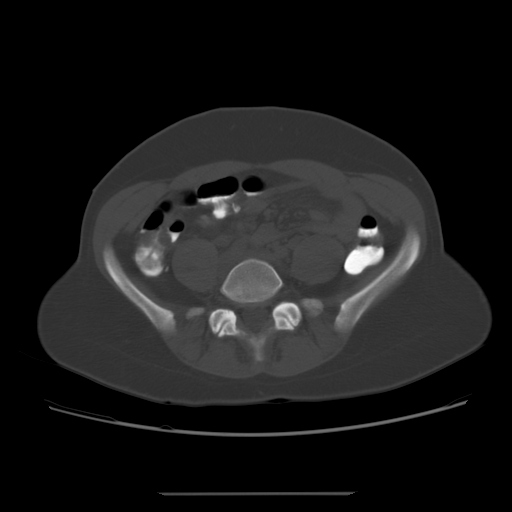
[im 42/60  soft-tissue]
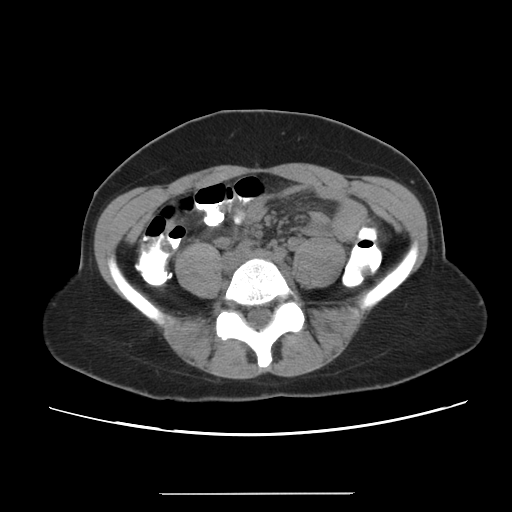
[im 48/60  soft-tissue]
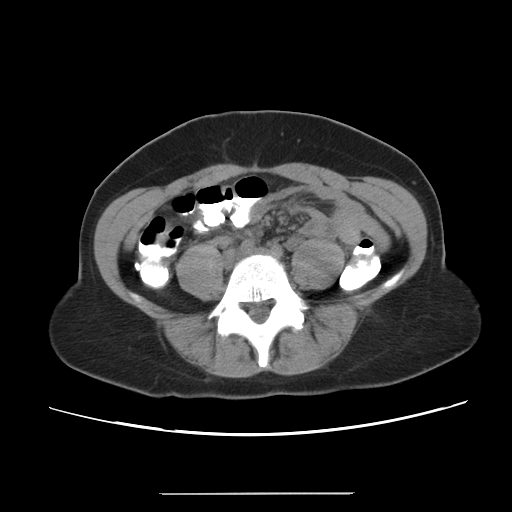
[im 52/60  soft-tissue]
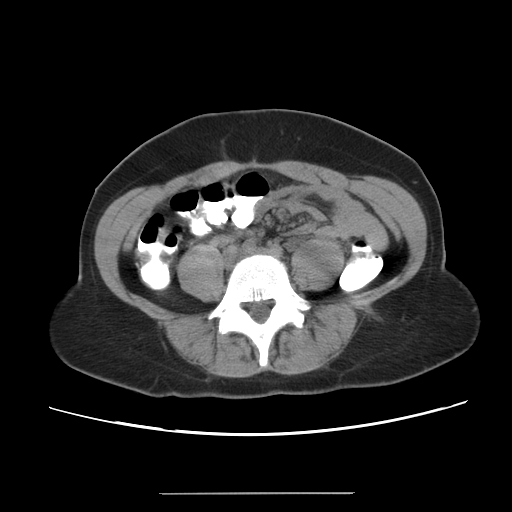
[im 52/60  lung]
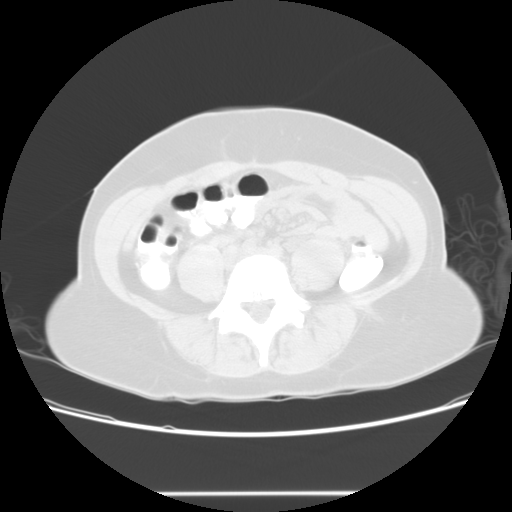
[im 54/60  lung]
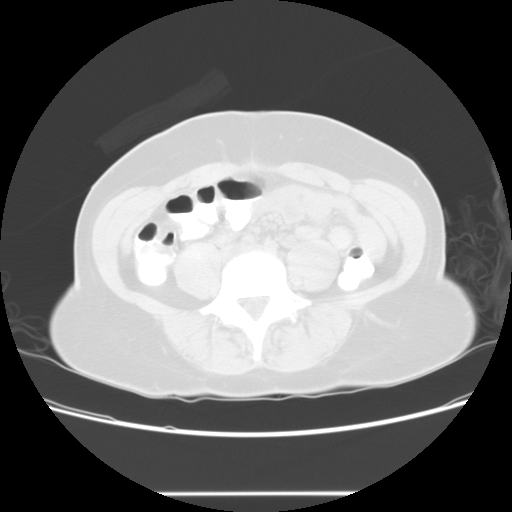
[im 56/60  soft-tissue]
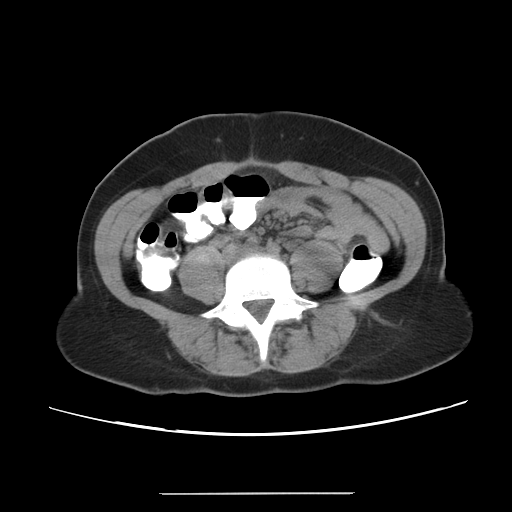
[im 56/60  lung]
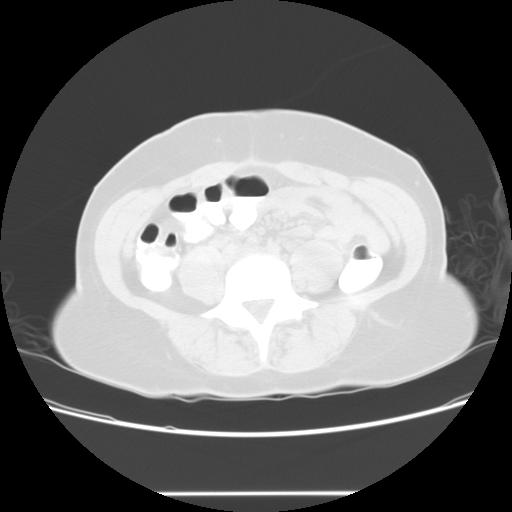
[im 58/60  lung]
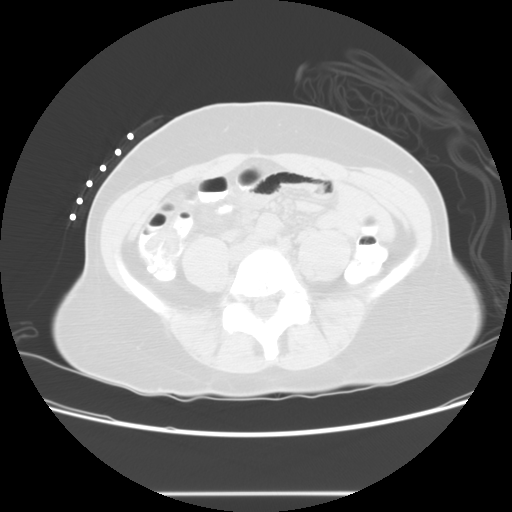

[15 of 32 positions shown; findings below may reference images not displayed]

FINDINGS: Images document needle placement in the right lower
quadrant peritoneal fluid collection.  Post aspiration images
demonstrate some improvement.

Complications: None
IMPRESSION: Successful right lower quadrant peritoneal abscess aspiration
yielding 1 ml of pus.  No drain was placed.
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
## 2010-06-26 NOTE — Discharge Summary (Signed)
NAME:  Shelley Cunningham, Shelley Cunningham           ACCOUNT NO.:  [id number]   MEDICAL RECORD NO.:  [id number]          PATIENT TYPE:  INP   LOCATION:  5121                         FACILITY:  [facility]   PHYSICIAN:  Revonda Standard L. Rennis Harding, N.P. DATE OF BIRTH:  05-Jul-1965   DATE OF ADMISSION:  06/27/2007  DATE OF DISCHARGE:  07/01/2007                               DISCHARGE SUMMARY   ADMISSION PHYSICIAN:  Lennie Muckle, MD.   DISCHARGING PHYSICIAN:  Dr. Daphine Deutscher.   CHIEF COMPLAINT/REASON FOR ADMISSION:  [name] is a 45 year old  patient who presented to the ER with a 4- to- 5-day history of cramping  abdominal pain, very generalized, worse in the lower abdomen.  24 hours  prior to presenting to the ER, she had chills without documented fevers  and abdominal distention and lower abdominal pressure with urinating.  She also has a history of constipation and was having difficulty passing  bowel movement.  Because of the severity of the pain, the patient  presented to the ER.  CT scan was performed without contrast that  demonstrated inflammation around the ileum.  The appendix was normal.  There was also extraluminal gassing with inflammatory changes with a  small amount of intraperitoneal free fluid.  White count was normal at  7700, and on further review, it appeared that the area of inflammation  involved the cecum.  No history of Crohn's disease personally or in the  family and the patient with known history of chronic GI symptoms related  to Crohn's disease.   On exam, the patient's abdomen was distended with good bowel sounds and  tender to palpation over all quadrants, but more so in the right lower  quadrant and over the suprapubic area.  No rebounding was noted.  Her  vital signs were stable and she was afebrile.   The patient was admitted with the following diagnoses:  Inflammatory  reaction around the ileum and right lower quadrant.  Differential  includes ileal diverticulosis/right-sided colon  tics versus Crohn's  disease.   HOSPITAL COURSE:  The patient was admitted with plans to repeat a CT  scan at this time with contrast, n.p.o. until the scan was done, empiric  Zosyn was started and she was started on DVT prophylaxis with Lovenox  and SCDs as well as Protonix for GI prophylaxis.  In addition, she had  had a wet prep done and vaginal exam due to her symptoms to rule out PID  as etiology and she was positive for Trichomonas and clue cells, so she  was given a 1-time dose of 2 g Flagyl p.o. for this.   Repeat CT scan demonstrated a right lower quadrant abscess.  Interventional radiology was contacted and 1 mL of pus was aspirated  from the fluid collection and sent for culture.   The patient remained n.p.o. with IV fluid hydration, and by hospital day  #2, on Jun 29, 2007, her pain had improved.  Her abdomen was soft.  She  was nontender in the right lower quadrant, normoactive bowel sounds.  Therefore, it was felt we could begin to start clear liquid diet on this  patient.   Throughout the remainder of the hospitalization, the patient did quite  well.  Her white count remained normal, last was checked on Jun 30, 2007, and demonstrated a white count of 4600, hemoglobin 11.3,  creatinine stable at 0.76.  She was advanced to a full liquid diet and  subsequently to a solid diet on Jun 30, 2007, and by Jul 01, 2007, she  was doing very well and 100% of her solid diet.  She was having some  mild left lower quadrant tenderness.  No right lower quadrant pain.  No  guarding or rebounding and it was felt she was appropriate for discharge  home.  The patient agreed.   During the hospitalization, it was discovered that the patient may or  may not have a history of diverticular disease, and based on clinical  findings, it was felt that the patient's symptoms were probably more  related to diverticular issues.  In addition, it was discovered that her  mother died 1 year ago with  breast cancer and the patient has not had a  mammogram.  She also does not have a primary care physician.  Case  management was contacted.  The patient will follow up with [facility]  and is planning on having a mammogram soon.  She has deferred at the  present time because she has had a recent left shoulder surgery and feel  she could not tolerate the mammogram equipment at this point.   In regards to the abscess, cultures returned positive for E. coli that  was resistant to ampicillin, but otherwise pansensitive.  She was  started on oral Cipro and this will be continued for 7 days after  discharge.  In addition, the patient was complaining of vaginal  candidiasis symptoms.  Prior to discharge, she was given a single dose  of Diflucan 200 mg p.o.   FINAL DISCHARGE DIAGNOSES:  1. 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  2. Abscess, culture positive for Escherichia coli resistance to      ampicillin.  3. Vaginal candidiasis.  4. Recent shoulder surgery recuperating well.  5. Significant family medical history for breast cancer.  The patient      is aware that she needs mammogram   DISCHARGE MEDICATIONS:  The patient will resume the following home  medications:  1. Tramadol 50 mg p.o. q.6 h. p.r.n. pain, refill has been given due      to abdominal symptoms.  2. Ibuprofen 800 mg p.o. q.8 h. p.r.n. pain, refill for this has also      been given.  3. She has been started on Cipro 500 mg b.i.d. for the next 7 days.  4. She has also been started on Protonix 4 mg daily since she has been      taking ibuprofen regularly since her initial shoulder surgery.   DISCHARGE INSTRUCTIONS:  Prior to discharge, the patient will receive  handouts and instruction regarding diverticulosis and diverticular diet.  Return to work based on prior orthopedic surgeons recommendations and  restrictions.  This includes restrictions in activity based  on this,  otherwise no restrictions in activity.  Diet, no restrictions at this  time.  I recommended she follow the diverticular diet, handout, wound  care not applicable.  Followup, we will schedule an appointment for her  to be seen by Dr. Freida Busman in 1-2 weeks of the office, the telephone number  is [phone].  We are doing this because the patient does not have a  primary care physician established yet.   OTHER INSTRUCTIONS:  Again, she has been instructed to establish with a  primary care physician with [facility] and make sure she is scheduled  for mammogram within the next few months.  In addition, we have asked  her to call our office if she develops fever greater than 101 degrees  Fahrenheit, nausea, nausea, vomiting or increased belly pain.      Allison L. Rennis Harding, N.P.    ALE/MEDQ  D:  07/01/2007  T:  07/02/2007  Job:  [id number]   cc:   Lennie Muckle, MD

## 2010-11-07 LAB — BASIC METABOLIC PANEL
Calcium: 9
GFR calc non Af Amer: >60
Glucose, Bld: 108 — ABNORMAL HIGH
Sodium: 138

## 2010-11-07 LAB — URINALYSIS, ROUTINE W REFLEX MICROSCOPIC
Bilirubin Urine: NEGATIVE
Glucose, UA: NEGATIVE
Ketones, ur: 15 — AB
Nitrite: NEGATIVE
Protein, ur: NEGATIVE
Specific Gravity, Urine: 1.027
Urobilinogen, UA: 1

## 2010-11-07 LAB — DIFFERENTIAL
Basophils Absolute: 0
Basophils Relative: 0
Lymphocytes Relative: 18
Lymphs Abs: 1.4
Monocytes Absolute: 0.5
Monocytes Relative: 6
Neutrophils Relative %: 75

## 2010-11-07 LAB — RAPID URINE DRUG SCREEN, HOSP PERFORMED
Amphetamines: NOT DETECTED
Benzodiazepines: NOT DETECTED

## 2010-11-07 LAB — URINE MICROSCOPIC-ADD ON

## 2010-11-07 LAB — CBC
Hemoglobin: 11.3 — ABNORMAL LOW
Hemoglobin: 11.8 — ABNORMAL LOW
MCHC: 33.7
Platelets: 197
Platelets: 216
RDW: 13
RDW: 13.8

## 2010-11-07 LAB — ETHANOL: Alcohol, Ethyl (B): <5

## 2010-11-07 LAB — COMPREHENSIVE METABOLIC PANEL
Alkaline Phosphatase: 65
CO2: 27
Calcium: 9.4
Creatinine, Ser: 0.74
Total Bilirubin: 0.9

## 2010-11-07 LAB — WET PREP, GENITAL
Clue Cells Wet Prep HPF POC: NONE SEEN
Yeast Wet Prep HPF POC: NONE SEEN

## 2010-11-07 LAB — CULTURE, ROUTINE-ABSCESS

## 2010-11-07 LAB — GC/CHLAMYDIA PROBE AMP, GENITAL: Chlamydia, DNA Probe: NEGATIVE

## 2010-11-07 LAB — RPR: RPR Ser Ql: NONREACTIVE

## 2010-11-22 LAB — I-STAT 8, (EC8 V) (CONVERTED LAB)
Acid-base deficit: 1
Hemoglobin: 12.6
Operator id: 146091
Potassium: 3.7
Sodium: 140
TCO2: 25

## 2010-11-22 LAB — DIFFERENTIAL
Lymphocytes Relative: 37
Lymphs Abs: 1.7
Monocytes Absolute: 0.4
Monocytes Relative: 8
Neutro Abs: 2.5

## 2010-11-22 LAB — CBC
Hemoglobin: 11.5 — ABNORMAL LOW
MCHC: 33.5
RBC: 3.74 — ABNORMAL LOW

## 2010-11-22 LAB — POCT CARDIAC MARKERS
CKMB, poc: <1 — ABNORMAL LOW
Troponin i, poc: <0.05

## 2019-06-22 LAB — TSH: TSH: 0 — AB (ref <=5.90)

## 2019-08-19 ENCOUNTER — Other Ambulatory Visit: Payer: Self-pay | Admitting: Surgery

## 2019-08-19 DIAGNOSIS — R9389 Abnormal findings on diagnostic imaging of other specified body structures: Secondary | ICD-10-CM

## 2019-09-02 ENCOUNTER — Other Ambulatory Visit: Payer: Self-pay

## 2019-09-03 ENCOUNTER — Ambulatory Visit
Admission: RE | Admit: 2019-09-03 | Discharge: 2019-09-03 | Disposition: A | Discharge status: Home / Self Care | Payer: 59 | Source: Ambulatory Visit | Attending: Surgery | Admitting: Surgery

## 2019-09-03 ENCOUNTER — Other Ambulatory Visit: Payer: Self-pay

## 2019-09-03 ENCOUNTER — Other Ambulatory Visit (HOSPITAL_COMMUNITY): Payer: Self-pay | Admitting: Diagnostic Radiology

## 2019-09-03 DIAGNOSIS — R9389 Abnormal findings on diagnostic imaging of other specified body structures: Secondary | ICD-10-CM

## 2019-09-03 IMAGING — MG MM BREAST LOCALIZATION CLIP
4 series · 4 of 12 positions shown · non-contrast
Comparison: Previous exam(s).

CLINICAL DATA: 53-year-old patient had 2 right breast MRI guided
biopsies today.

EXAM:
DIAGNOSTIC RIGHT MAMMOGRAM POST MRI BIOPSIES

[R ML synth-2D]
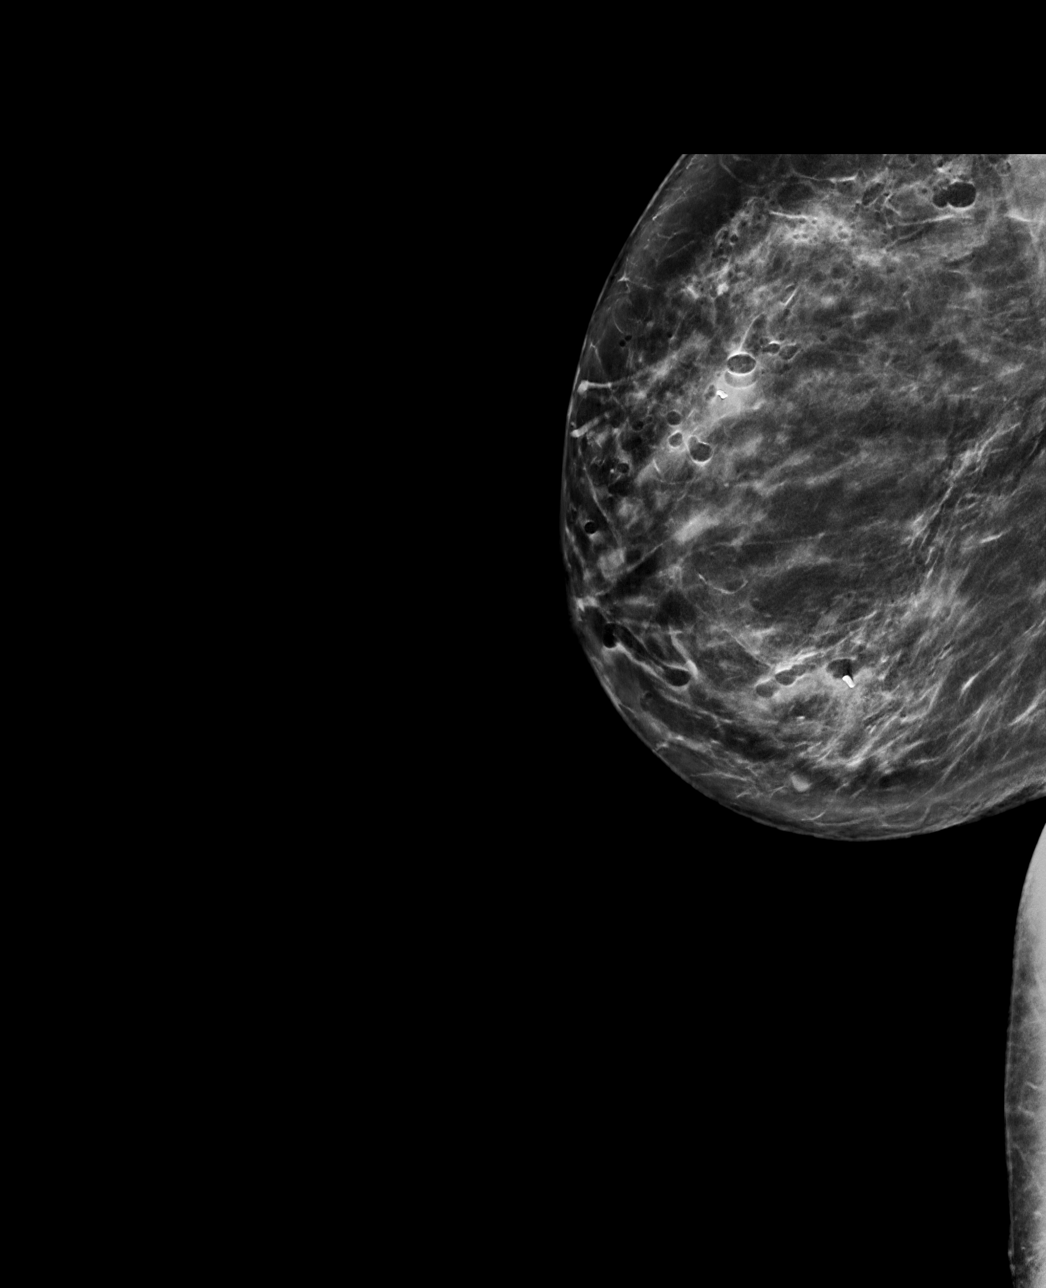

[R CC synth-2D]
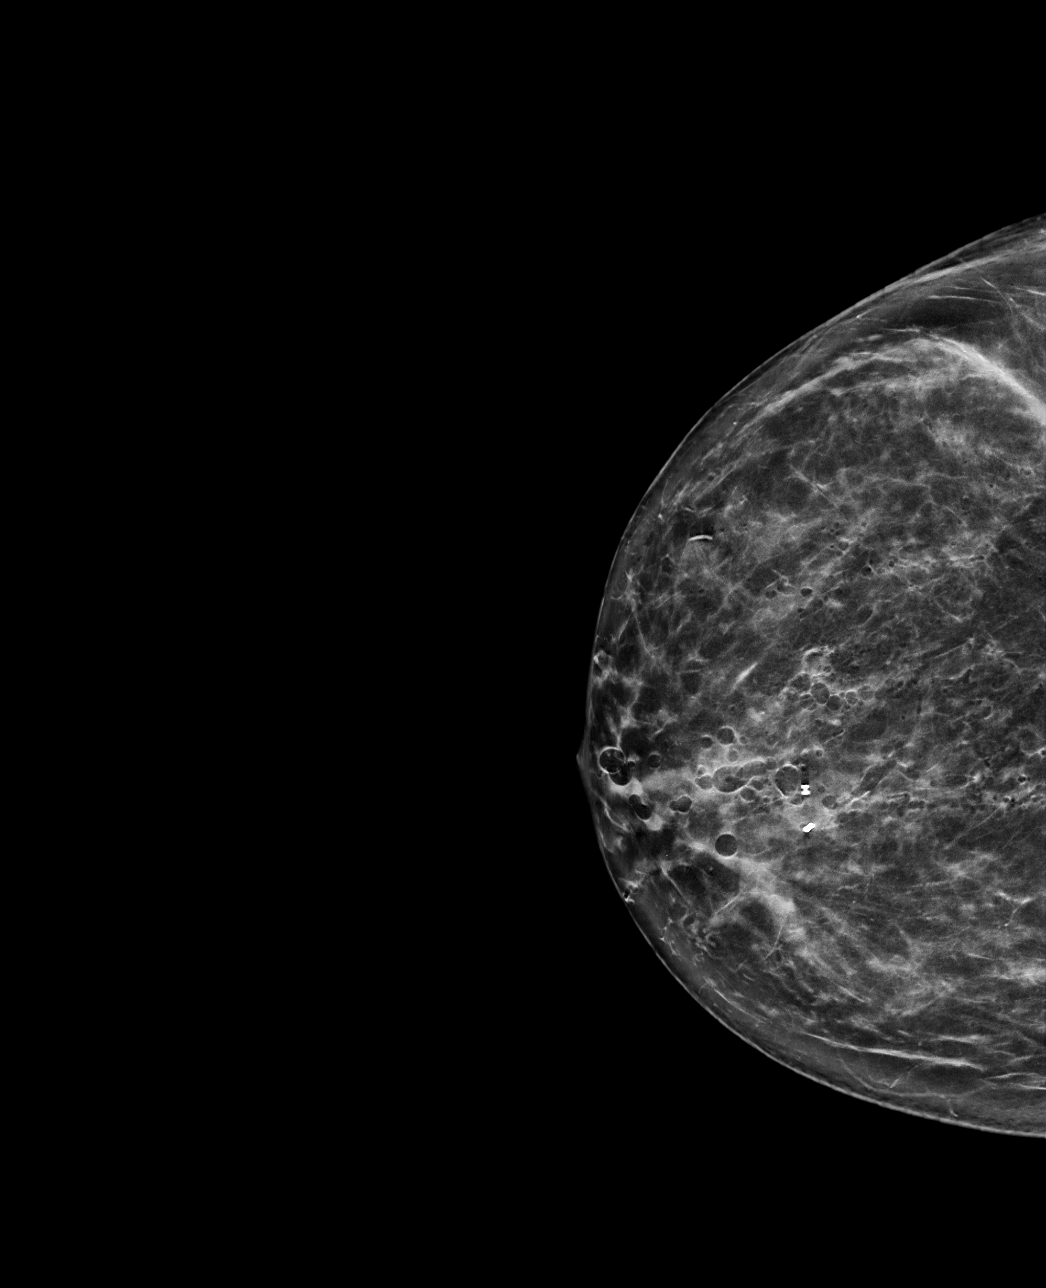

[R ML tomo · tomo slice 45/90.0]
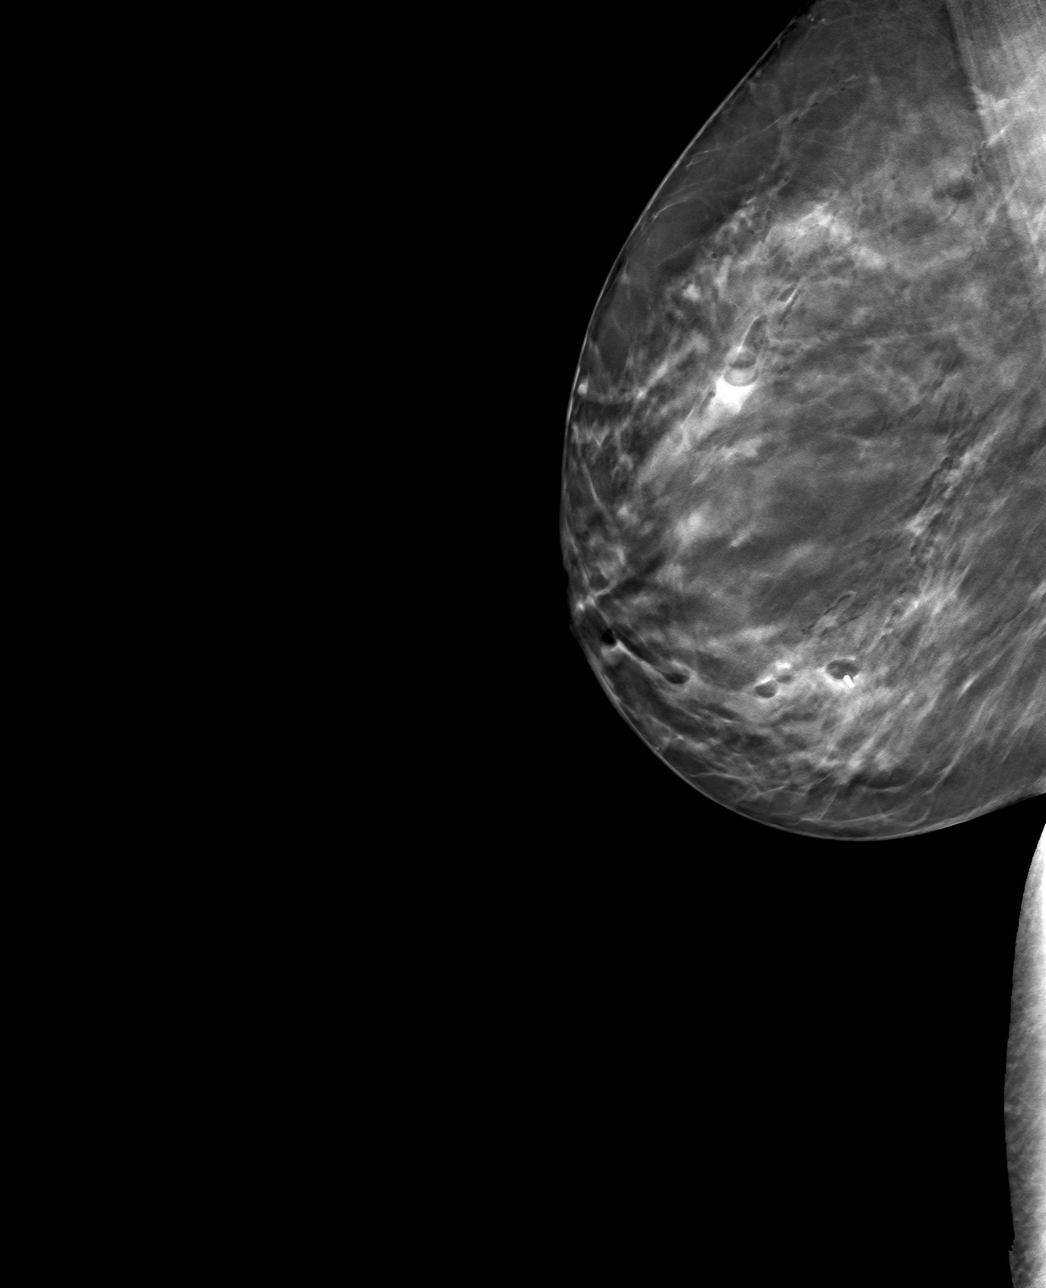

[R CC tomo · tomo slice 41/81.0]
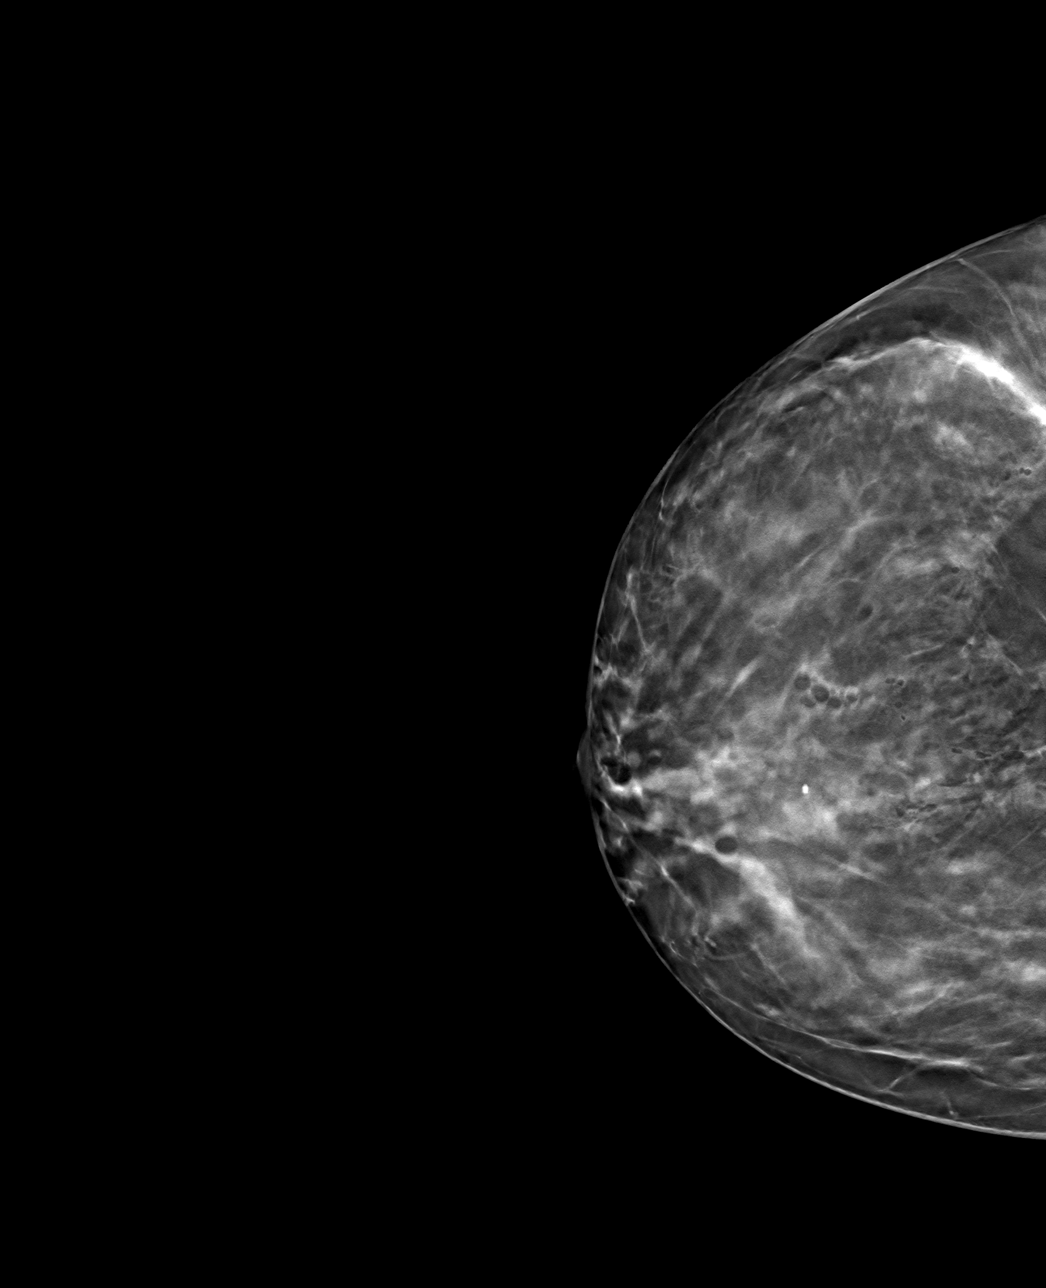

[4 of 12 positions shown; findings below may reference images not displayed]

FINDINGS: Mammographic images were obtained following MRI guided biopsies of
the right breast. The barbell shaped biopsy marking clip is in the
expected location of the biopsy in the 12 o'clock axis of the right
breast, middle third. The cylindrical shaped biopsy clip is in the
expected location of the biopsy performed in the 6 o'clock axis of
the right breast, middle third.
IMPRESSION: Appropriate positioning of the barbell and cylinder shaped biopsy
marking clips at the site of biopsy in the 12 o'clock and 6 o'clock
axes of the right breast, respectively.

Final Assessment: Post Procedure Mammograms for Marker Placement

## 2019-09-03 IMAGING — MR MR BREAST BX W/ LOC DEV 1ST LEASION IMAGE BX SPEC MR GUIDE*R*
7 of 10 series · 32 of 48 positions shown · IV contrast (7 ml gadavist)
Comparison: Previous exams.
COMPARISON: Previous exams.

Addendum:
CLINICAL DATA: 53-year-old patient with history of biopsy-proven
papillomas in the left breast. Recently had br[REDACTED] breast MRI.
Two intraductal masses were identified in the right breast,
measuring 6 and 4 mm respectively in the 12 o'clock and 6 o'clock
positions. MRI guided biopsies of these 2 masses was recommended.

EXAM:
MRI GUIDED CORE NEEDLE BIOPSY OF THE RIGHT BREAST X 2
TECHNIQUE: Multiplanar, multisequence MR imaging of the right breast was
performed both before and after administration of intravenous
contrast.
CONTRAST:  7mL GADAVIST GADOBUTROL 1 MMOL/ML IV SOLN

[Series 2: fiducial unilateral · sagittal · 2.0mm · 1.33mm/px · 3 of 52 slices shown]
[im 1/52]
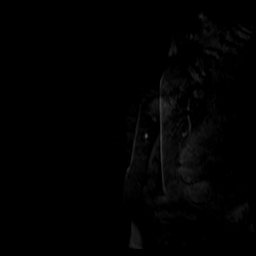
[im 26/52]
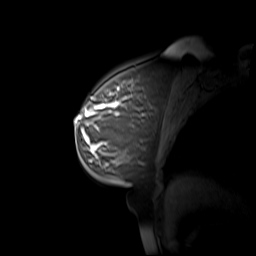
[im 52/52]
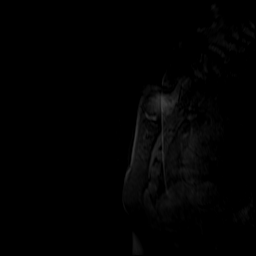

[Series 3: dynamic pre · axial · non-contrast · 1.3mm · 0.78mm/px · z∈[-92,+93]mm · 5 of 144 slices shown]
[im 1/144]
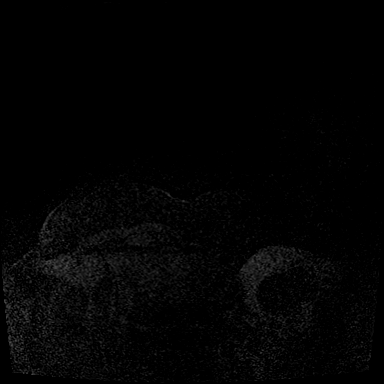
[im 36/144]
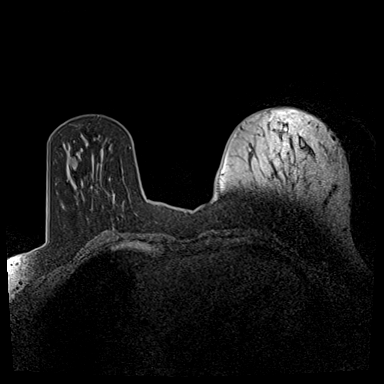
[im 72/144]
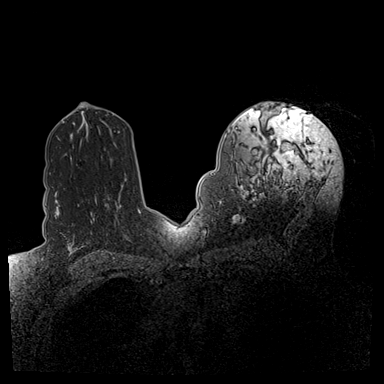
[im 108/144]
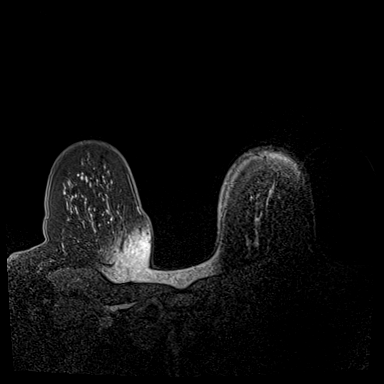
[im 144/144]
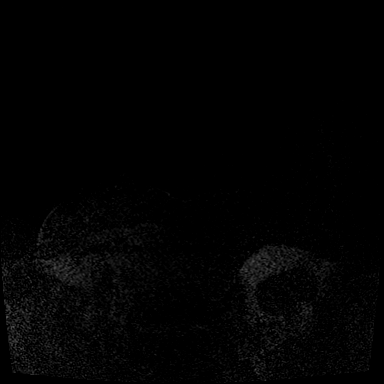

[Series 4: dynamic post 20 · axial · 1.3mm · 0.78mm/px · z∈[-92,+93]mm · 5 of 144 slices shown (1 of 2)]
[im 1/144]
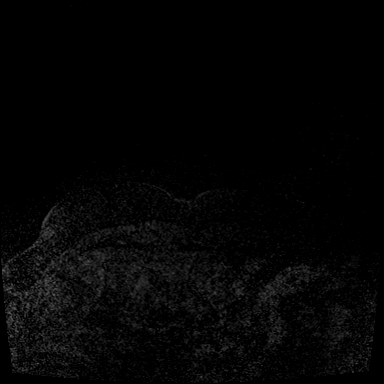
[im 36/144]
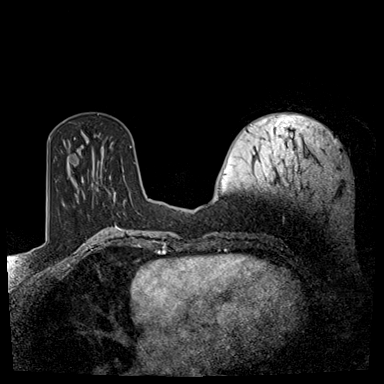
[im 72/144]
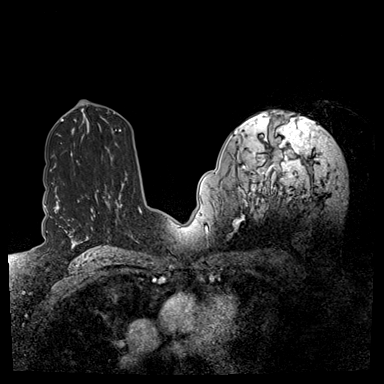
[im 108/144]
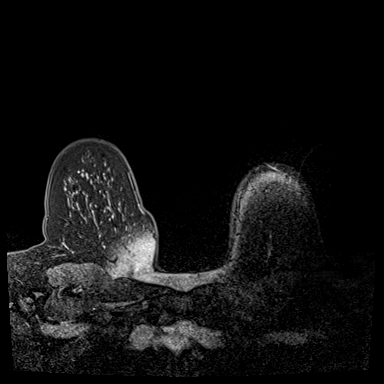
[im 144/144]
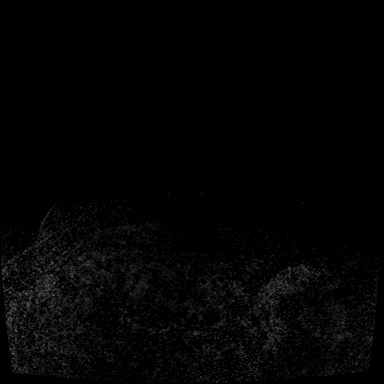

[Series 5: dynamic post 20 · axial · 1.3mm · 0.78mm/px · z∈[-92,+93]mm · 5 of 144 slices shown (2 of 2)]
[im 1/144]
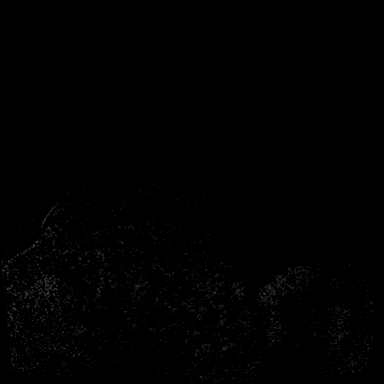
[im 36/144]
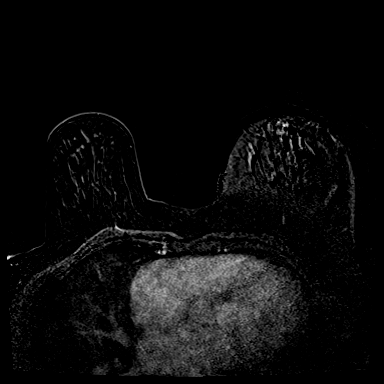
[im 72/144]
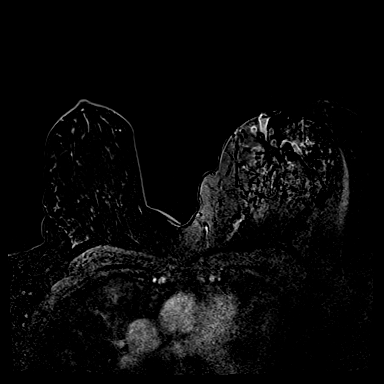
[im 108/144]
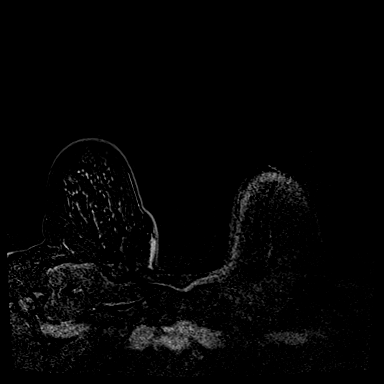
[im 144/144]
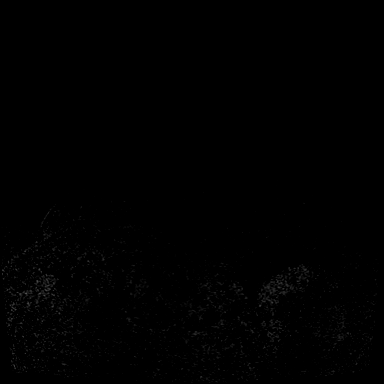

[Series 6: dynamic post 3 · axial · 1.3mm · 0.78mm/px · z∈[-92,+93]mm · 5 of 144 slices shown (1 of 2)]
[im 1/144]
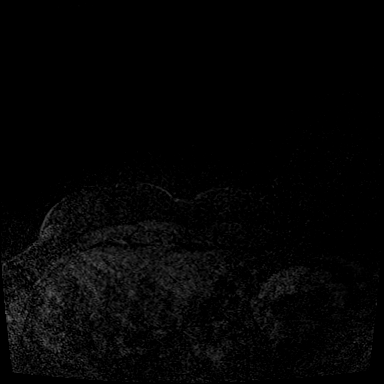
[im 36/144]
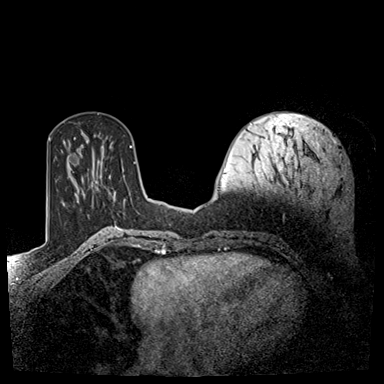
[im 72/144]
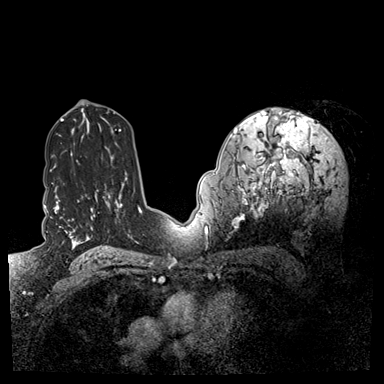
[im 108/144]
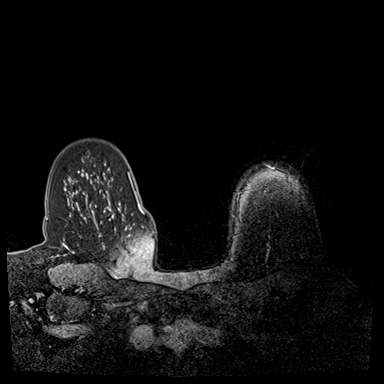
[im 144/144]
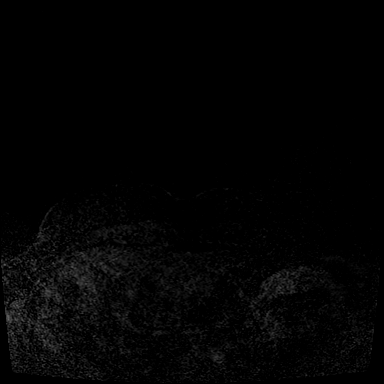

[Series 7: dynamic post 3 · axial · 1.3mm · 0.78mm/px · z∈[-92,+93]mm · 5 of 144 slices shown (2 of 2)]
[im 1/144]
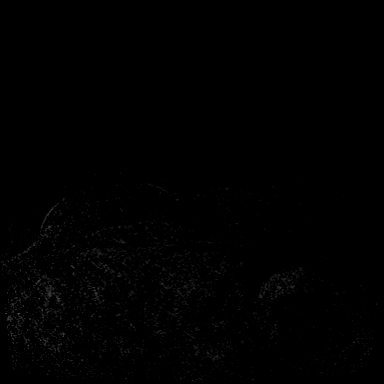
[im 36/144]
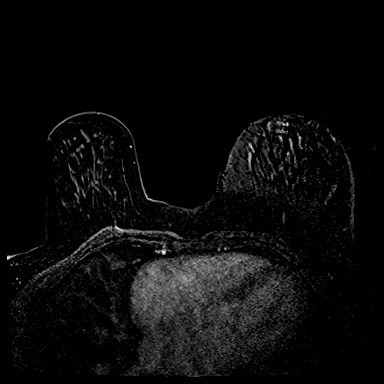
[im 72/144]
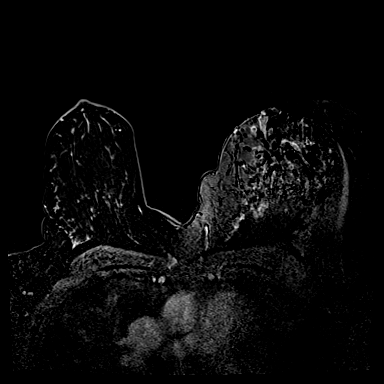
[im 108/144]
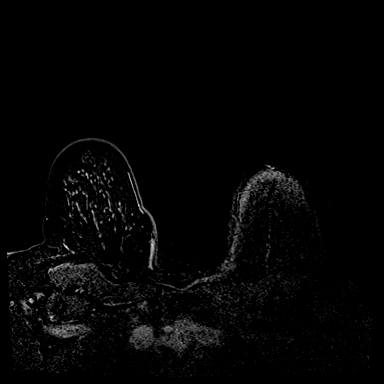
[im 144/144]
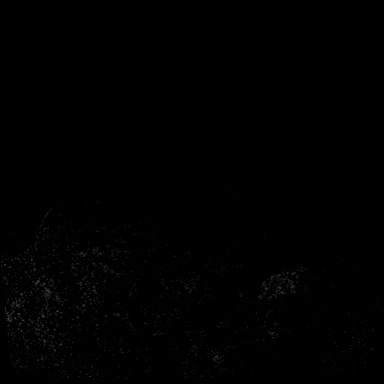

[Series 8: needle confirmation · axial · 1.3mm · 0.78mm/px · z∈[-92,+47]mm · 4 of 144 slices shown]
[im 1/144]
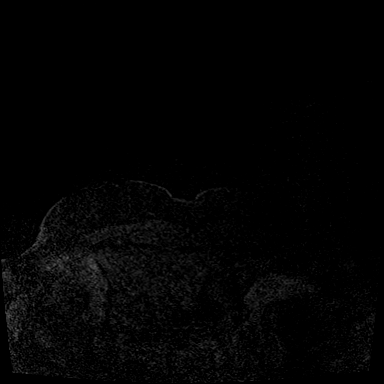
[im 36/144]
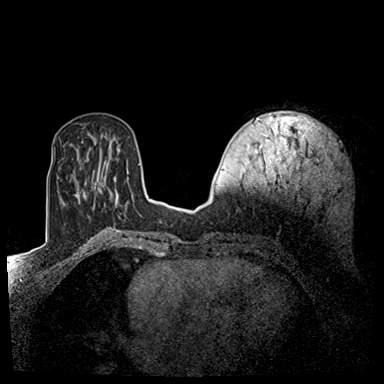
[im 72/144]
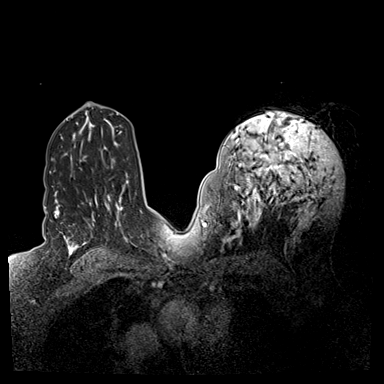
[im 108/144]
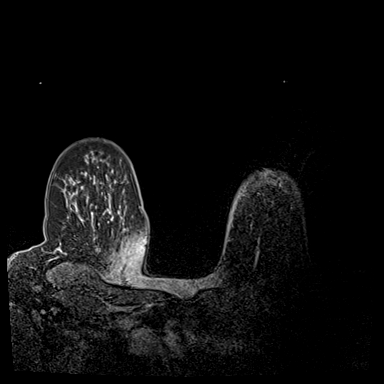

[32 of 48 positions shown; findings below may reference images not displayed]

FINDINGS: I met with the patient, and we discussed the procedure of MRI guided
biopsy, including risks, benefits, and alternatives. Specifically,
we discussed the risks of infection, bleeding, tissue injury, clip
migration, and inadequate sampling. Informed, written consent was
given. The usual time out protocol was performed immediately prior
to the procedure.

Using sterile technique, 1% Lidocaine, MRI guidance, and a 9 gauge
vacuum assisted device, biopsy was performed of a 6 mm mass in the
12 o'clock position of the right breast using a lateral approach. At
the conclusion of the procedure, a barbell tissue marker clip was
deployed into the biopsy cavity. Follow-up 2-view mammogram was
performed and dictated separately.

Using sterile technique, 1% Lidocaine, MRI guidance, and a 9 gauge
vacuum assisted device, biopsy was performed of a 4 mm mass in the 6
o'clock position of the right breast using a lateral approach. At
the conclusion of the procedure, a cylinder tissue marker clip was
deployed into the biopsy cavity. Follow-up 2-view mammogram was
performed and dictated separately.
IMPRESSION: Two MRI guided biopsies of the right breast. No apparent
complications.

ADDENDUM:
Pathology revealed INTRADUCTAL PAPILLOMA of the Right breast, both
locations, 12 o'clock, (barbell clip) and 6 o'clock, (cylinder
clip). This was found to be concordant by Dr. RONLOR, with
surgical consultation for possible excision recommended. The patient
has known papillomatosis.

Pathology results were discussed with the patient by telephone. The
patient reported doing well after the biopsies with tenderness and
bleeding at the sites. Post biopsy instructions and care were
reviewed and questions were answered. The patient was encouraged to
call The [REDACTED] for any additional
concerns. My direct phone number was provided.

Surgical consultation has been arranged with Dr. RONLOR at
[REDACTED], [HOSPITAL] location on [DATE].

Pathology results reported by RONLOR, RN on [DATE].

*** End of Addendum ***
FINDINGS: I met with the patient, and we discussed the procedure of MRI guided
biopsy, including risks, benefits, and alternatives. Specifically,
we discussed the risks of infection, bleeding, tissue injury, clip
migration, and inadequate sampling. Informed, written consent was
given. The usual time out protocol was performed immediately prior
to the procedure.

Using sterile technique, 1% Lidocaine, MRI guidance, and a 9 gauge
vacuum assisted device, biopsy was performed of a 6 mm mass in the
12 o'clock position of the right breast using a lateral approach. At
the conclusion of the procedure, a barbell tissue marker clip was
deployed into the biopsy cavity. Follow-up 2-view mammogram was
performed and dictated separately.

Using sterile technique, 1% Lidocaine, MRI guidance, and a 9 gauge
vacuum assisted device, biopsy was performed of a 4 mm mass in the 6
o'clock position of the right breast using a lateral approach. At
the conclusion of the procedure, a cylinder tissue marker clip was
deployed into the biopsy cavity. Follow-up 2-view mammogram was
performed and dictated separately.
IMPRESSION: Two MRI guided biopsies of the right breast. No apparent
complications.

## 2019-09-03 MED ORDER — GADOBUTROL 1 MMOL/ML IV SOLN
7.0000 mL | Freq: Once | INTRAVENOUS | Status: AC | PRN
  Administered 2019-09-03: 7 mL via INTRAVENOUS

## 2019-09-22 ENCOUNTER — Encounter: Payer: Self-pay | Admitting: Endocrinology

## 2019-09-22 LAB — LAB REPORT - SCANNED
Prolactin: 27.3
T3, Free: 13.2
T4, Total: 2.43

## 2019-11-26 ENCOUNTER — Other Ambulatory Visit: Payer: Self-pay

## 2019-11-26 ENCOUNTER — Ambulatory Visit (INDEPENDENT_AMBULATORY_CARE_PROVIDER_SITE_OTHER): Payer: 59 | Admitting: Endocrinology

## 2019-11-26 ENCOUNTER — Encounter: Payer: Self-pay | Admitting: Endocrinology

## 2019-11-26 DIAGNOSIS — E0590 Thyrotoxicosis, unspecified without thyrotoxic crisis or storm: Secondary | ICD-10-CM | POA: Diagnosis not present

## 2019-11-26 LAB — TSH: TSH: 18.47 u[IU]/mL — ABNORMAL HIGH (ref 0.35–4.50)

## 2019-11-26 LAB — T4, FREE: Free T4: 0.27 ng/dL — ABNORMAL LOW (ref 0.60–1.60)

## 2019-11-26 MED ORDER — PROPRANOLOL HCL ER 80 MG PO CP24: 80.0000 mg | ORAL_CAPSULE | Freq: Every day | ORAL | 3 refills | Status: DC

## 2019-11-26 NOTE — Patient Instructions (Addendum)
Blood tests are requested for you today.  We'll let you know about the results.  If ever you have fever while taking methimazole, stop it and call us, even if the reason is obvious, because of the risk of a rare side-effect. Stop taking the methimazole 1 week prior to this test: let's check a thyroid "scan" (a special, but easy and painless type of thyroid x ray).  It works like this: you go to the x-ray department of the hospital to swallow a pill, which contains a miniscule amount of radiation.  You will not notice any symptoms from this.  You will go back to the x-ray department the next day, to lie down in front of a camera.  The results of this will be sent to me.   Based on the results, i hope to order for you a treatment pill of radioactive iodine.  Although it is a larger amount of radiation, you will again notice no symptoms from this.  The pill is gone from your body in a few days (during which you should stay away from other people), but takes several months to work.  please return here approximately 4-5 days after the treatment pill.  This treatment has been available for many years, and the only known side-effect is an underactive thyroid.  It is possible that i would eventually prescribe for you a thyroid hormone pill, which is very inexpensive.  You don't have to worry about side-effects of this thyroid hormone pill, because it is the same molecule your thyroid makes.  I have sent a prescription to your pharmacy to change propranolol to a once per day pill.

## 2019-11-26 NOTE — Progress Notes (Addendum)
Subjective:    Patient ID: Shelley Cunningham, female    DOB: Jul 03, 1965, 54 y.o.   MRN: [id number]  HPI Pt is referred by Carlyle Lipa, NP, for hyperthyroidism.  Pt reports she was dx'ed with hyperthyroidism in 2015, when she lived in [place].  She was rx'ed tapazole.  she has never had XRT to the anterior neck, or thyroid surgery.  she does not consume kelp or any other non-prescribed thyroid medication.  she has never been on amiodarone.  She had nuc med scan in [place] at time of dx. She had thyroid bx in [place] in 2021.  She says she had thyroid storm in [place] in 2019.  She now takes tapazole 10 mg TID, and inderal 40-BID.  No recent change in dosage of either medication.  She reports tremor, doe, diaphoresis, anxiety, heat intolerance, and palpitations.  Pt says she sometimes misses meds.    Social History   Socioeconomic History  . Marital status: Single    Spouse name: Not on file  . Number of children: Not on file  . Years of education: Not on file  . Highest education level: Not on file  Occupational History  . Not on file  Tobacco Use  . Smoking status: Never Smoker  . Smokeless tobacco: Never Used  Substance and Sexual Activity  . Alcohol use: Not on file  . Drug use: Yes    Types: Marijuana  . Sexual activity: Not on file  Other Topics Concern  . Not on file  Social History Narrative  . Not on file   Social Determinants of Health   Financial Resource Strain:   . Difficulty of Paying Living Expenses: Not on file  Food Insecurity:   . Worried About Charity fundraiser in the Last Year: Not on file  . Ran Out of Food in the Last Year: Not on file  Transportation Needs:   . Lack of Transportation (Medical): Not on file  . Lack of Transportation (Non-Medical): Not on file  Physical Activity:   . Days of Exercise per Week: Not on file  . Minutes of Exercise per Session: Not on file  Stress:   . Feeling of Stress : Not on file  Social Connections:   . Frequency of  Communication with Friends and Family: Not on file  . Frequency of Social Gatherings with Friends and Family: Not on file  . Attends Religious Services: Not on file  . Active Member of Clubs or Organizations: Not on file  . Attends Archivist Meetings: Not on file  . Marital Status: Not on file  Intimate Partner Violence:   . Fear of Current or Ex-Partner: Not on file  . Emotionally Abused: Not on file  . Physically Abused: Not on file  . Sexually Abused: Not on file    Current Outpatient Medications on File Prior to Visit  Medication Sig Dispense Refill  . methimazole (TAPAZOLE) 10 MG tablet Take 10 mg by mouth every 8 (eight) hours.    Marland Kitchen PARoxetine (PAXIL) 10 MG tablet Take 10 mg by mouth daily.    Marland Kitchen venlafaxine (EFFEXOR) 37.5 MG tablet Take 37.5 mg by mouth daily.     No current facility-administered medications on file prior to visit.    Allergies  Allergen Reactions  . Flagyl [Metronidazole] Nausea And Vomiting    Family History  Problem Relation Age of Onset  . Thyroid disease Neg Hx     BP 130/80   Pulse 66  Ht 5\' 4"  (1.626 m)   Wt 159 lb (72.1 kg)   SpO2 93%   BMI 27.29 kg/m     Review of Systems denies weight loss.       Objective:   Physical Exam VS: see vs page GEN: no distress HEAD: head: no deformity eyes: no periorbital swelling, no proptosis external nose and ears are normal NECK: thyroid is 5-10 times normal size--diffuse.   CHEST WALL: no deformity LUNGS: clear to auscultation CV: reg rate and rhythm, no murmur.  MUSCULOSKELETAL: gait is normal and steady EXTEMITIES: no deformity.  no leg edema NEURO:  cn 2-12 grossly intact.   readily moves all 4's.  sensation is intact to touch on all 4's.  slight tremor of the hands.   SKIN:  Normal texture and temperature.  No rash or suspicious lesion is visible.  Not diaphoretic NODES:  None palpable at the neck PSYCH: alert, well-oriented.  Does not appear anxious nor depressed.   Lab  Results  Component Value Date   TSH 0.00 (A) 06/22/2019   T4TOTAL 2.43 06/22/2019   Outside records are requested.  Pt signs ROI  Update 11/30/19: office has located outside records.  Prolactin=27    Assessment & Plan:  Hyperthyroidism, new to me Noncompliance with medication: we'll decrease frequency of meds Hyperprolactinemia: recheck next time  Patient Instructions  Blood tests are requested for you today.  We'll let you know about the results.  If ever you have fever while taking methimazole, stop it and call us, even if the reason is obvious, because of the risk of a rare side-effect. Stop taking the methimazole 1 week prior to this test: let's check a thyroid "scan" (a special, but easy and painless type of thyroid x ray).  It works like this: you go to the x-ray department of the hospital to swallow a pill, which contains a miniscule amount of radiation.  You will not notice any symptoms from this.  You will go back to the x-ray department the next day, to lie down in front of a camera.  The results of this will be sent to me.   Based on the results, i hope to order for you a treatment pill of radioactive iodine.  Although it is a larger amount of radiation, you will again notice no symptoms from this.  The pill is gone from your body in a few days (during which you should stay away from other people), but takes several months to work.  please return here approximately 4-5 days after the treatment pill.  This treatment has been available for many years, and the only known side-effect is an underactive thyroid.  It is possible that i would eventually prescribe for you a thyroid hormone pill, which is very inexpensive.  You don't have to worry about side-effects of this thyroid hormone pill, because it is the same molecule your thyroid makes.  I have sent a prescription to your pharmacy to change propranolol to a once per day pill.

## 2019-11-29 ENCOUNTER — Telehealth: Payer: Self-pay

## 2019-11-29 NOTE — Telephone Encounter (Signed)
Left message for patient to call office regarding results and recommendations. 

## 2019-11-29 NOTE — Telephone Encounter (Signed)
-----   Message from Renato Shin, MD sent at 11/27/2019  9:29 AM EDT ----- please contact patient: Thyroid has gone low.  You can go ahead and stop the methimazole now.  Next step is the iodine pill.  you will receive a phone call, about a day and time for an appointment

## 2019-11-29 NOTE — Telephone Encounter (Signed)
Patient aware of results and recommendations. °

## 2019-12-15 ENCOUNTER — Encounter (HOSPITAL_COMMUNITY)
Admission: RE | Admit: 2019-12-15 | Discharge: 2019-12-15 | Disposition: A | Discharge status: Home / Self Care | Payer: 59 | Source: Ambulatory Visit | Attending: Endocrinology | Admitting: Endocrinology

## 2019-12-15 ENCOUNTER — Other Ambulatory Visit: Payer: Self-pay

## 2019-12-15 DIAGNOSIS — E0590 Thyrotoxicosis, unspecified without thyrotoxic crisis or storm: Secondary | ICD-10-CM | POA: Insufficient documentation

## 2019-12-15 MED ORDER — SODIUM IODIDE I 131 CAPSULE
16.5000 | Freq: Once | INTRAVENOUS | Status: AC | PRN
  Administered 2019-12-15: 16.5 via ORAL

## 2019-12-16 ENCOUNTER — Encounter (HOSPITAL_COMMUNITY)
Admission: RE | Admit: 2019-12-16 | Discharge: 2019-12-16 | Disposition: A | Discharge status: Home / Self Care | Payer: 59 | Source: Ambulatory Visit | Attending: Endocrinology | Admitting: Endocrinology

## 2019-12-16 ENCOUNTER — Encounter (HOSPITAL_COMMUNITY): Payer: 59

## 2019-12-16 ENCOUNTER — Other Ambulatory Visit (HOSPITAL_COMMUNITY): Payer: 59

## 2019-12-16 ENCOUNTER — Other Ambulatory Visit: Payer: Self-pay | Admitting: Endocrinology

## 2019-12-16 DIAGNOSIS — E0590 Thyrotoxicosis, unspecified without thyrotoxic crisis or storm: Secondary | ICD-10-CM

## 2019-12-16 MED ORDER — SODIUM PERTECHNETATE TC 99M INJECTION
4.6000 | Freq: Once | INTRAVENOUS | Status: AC | PRN
  Administered 2019-12-16: 4.6 via INTRAVENOUS

## 2019-12-16 MED ORDER — TECHNETIUM TC 99M TETROFOSMIN IV KIT: 31.0000 | PACK | Freq: Once | INTRAVENOUS | Status: DC | PRN

## 2019-12-17 ENCOUNTER — Other Ambulatory Visit (HOSPITAL_COMMUNITY): Payer: 59

## 2019-12-17 ENCOUNTER — Telehealth: Payer: Self-pay

## 2019-12-17 NOTE — Telephone Encounter (Signed)
Patient aware of results and recommendations. °

## 2019-12-17 NOTE — Telephone Encounter (Signed)
-----   Message from Renato Shin, MD sent at 12/16/2019  4:27 PM EDT ----- please contact patient: We are ready to go ahead with the Radioactive iodine treatment pill.  you will receive a phone call, about a day and time for an appointment.  Please come back for a follow-up appointment 6-8 weeks later

## 2020-02-15 ENCOUNTER — Encounter (HOSPITAL_COMMUNITY)
Admission: RE | Admit: 2020-02-15 | Discharge: 2020-02-15 | Disposition: A | Discharge status: Home / Self Care | Payer: 59 | Source: Ambulatory Visit | Attending: Endocrinology | Admitting: Endocrinology

## 2020-02-15 ENCOUNTER — Other Ambulatory Visit: Payer: Self-pay

## 2020-02-15 DIAGNOSIS — E0590 Thyrotoxicosis, unspecified without thyrotoxic crisis or storm: Secondary | ICD-10-CM | POA: Diagnosis not present

## 2020-02-15 MED ORDER — SODIUM IODIDE I 131 CAPSULE: 13.3600 | Freq: Once | INTRAVENOUS | Status: DC | PRN

## 2020-02-16 NOTE — Telephone Encounter (Signed)
That is up to you, depending on how bad you were feeling before you started the methimazole.  I am fine either way.

## 2020-02-16 NOTE — Telephone Encounter (Signed)
Patient called asking if she needs to continue taking her medication after the radioactive tx? Ph# [phone]

## 2020-02-16 NOTE — Telephone Encounter (Signed)
Please see below.

## 2020-02-17 NOTE — Telephone Encounter (Signed)
Notified pt with Dr. Everardo All message. Pt decided to continue taking the methimazole until coming up appt.

## 2020-04-05 ENCOUNTER — Other Ambulatory Visit: Payer: Self-pay

## 2020-04-07 ENCOUNTER — Ambulatory Visit (INDEPENDENT_AMBULATORY_CARE_PROVIDER_SITE_OTHER): Payer: 59 | Admitting: Endocrinology

## 2020-04-07 ENCOUNTER — Other Ambulatory Visit: Payer: Self-pay

## 2020-04-07 VITALS — BP 190/94 | HR 58 | Ht 64.0 in | Wt 162.2 lb

## 2020-04-07 DIAGNOSIS — E0590 Thyrotoxicosis, unspecified without thyrotoxic crisis or storm: Secondary | ICD-10-CM

## 2020-04-07 LAB — T4, FREE: Free T4: 0.64 ng/dL (ref 0.60–1.60)

## 2020-04-07 MED ORDER — METOPROLOL SUCCINATE ER 25 MG PO TB24: 25.0000 mg | ORAL_TABLET | Freq: Every day | ORAL | 3 refills | Status: DC

## 2020-04-07 NOTE — Patient Instructions (Addendum)
Blood tests are requested for you today.  We'll let you know about the results.  If ever you have fever while taking methimazole, stop it and call us, even if the reason is obvious, because of the risk of a rare side-effect. I have sent a prescription to your pharmacy to change propranolol to a different once per day pill.   Please come back for a follow-up appointment in 1 month.

## 2020-04-07 NOTE — Progress Notes (Signed)
   Subjective:    Patient ID: Shelley Cunningham, female    DOB: April 30, 1965, 55 y.o.   MRN: [id number]  HPI Pt returns for f/u of hyperthyroidism (dx'ed 2015, when she lived in [place]; She says she had thyroid storm in [place] in 2019; She had thyroid bx in [facility] in 2021; She took RAI 02/21/20).  She resumed the tapazole, but she sometimes misses.  She reports tremor, palpitations, and sweating.    No past medical history on file.  Social History   Socioeconomic History  . Marital status: Single    Spouse name: Not on file  . Number of children: Not on file  . Years of education: Not on file  . Highest education level: Not on file  Occupational History  . Not on file  Tobacco Use  . Smoking status: Never Smoker  . Smokeless tobacco: Never Used  Substance and Sexual Activity  . Alcohol use: Not on file  . Drug use: Yes    Types: Marijuana  . Sexual activity: Not on file  Other Topics Concern  . Not on file  Social History Narrative  . Not on file   Social Determinants of Health   Financial Resource Strain: Not on file  Food Insecurity: Not on file  Transportation Needs: Not on file  Physical Activity: Not on file  Stress: Not on file  Social Connections: Not on file  Intimate Partner Violence: Not on file    Current Outpatient Medications on File Prior to Visit  Medication Sig Dispense Refill  . methimazole (TAPAZOLE) 10 MG tablet Take 10 mg by mouth every 8 (eight) hours.    Marland Kitchen PARoxetine (PAXIL) 10 MG tablet Take 10 mg by mouth daily.    Marland Kitchen venlafaxine (EFFEXOR) 37.5 MG tablet Take 37.5 mg by mouth daily.     No current facility-administered medications on file prior to visit.    Allergies  Allergen Reactions  . Flagyl [Metronidazole] Nausea And Vomiting    Family History  Problem Relation Age of Onset  . Thyroid disease Neg Hx     BP (!) 190/94 (BP Location: Right Arm, Patient Position: Sitting, Cuff Size: Normal)   Pulse (!) 58   Ht 5\' 4"  (1.626 m)   Wt 162  lb 3.2 oz (73.6 kg)   SpO2 98%   BMI 27.84 kg/m   Review of Systems Denies fever.      Objective:   Physical Exam VITAL SIGNS:  See vs page GENERAL: no distress NECK: thyroid is 5-10 times normal size--diffuse.        Assessment & Plan:  Hyperthyroidism, on tapazole while RAI is working.  She is advised to continue it.  Bradycardia, due to inderal.   Patient Instructions  Blood tests are requested for you today.  We'll let you know about the results.  If ever you have fever while taking methimazole, stop it and call us, even if the reason is obvious, because of the risk of a rare side-effect. I have sent a prescription to your pharmacy to change propranolol to a different once per day pill.   Please come back for a follow-up appointment in 1 month.

## 2020-04-10 ENCOUNTER — Telehealth: Payer: Self-pay

## 2020-04-10 LAB — TSH: TSH: <0.01 u[IU]/mL — ABNORMAL LOW (ref 0.35–4.50)

## 2020-04-10 NOTE — Telephone Encounter (Signed)
Spoke to pt regarding lab results and advised her to continue medications as prescribed per Loanne Drilling.

## 2020-04-10 NOTE — Telephone Encounter (Signed)
Error

## 2020-04-18 ENCOUNTER — Telehealth: Payer: Self-pay | Admitting: Endocrinology

## 2020-04-18 NOTE — Telephone Encounter (Signed)
Pt called because her new blood pressure medication is giving her issues.  Ever since Dr changed this medication she has been having terrible headaches. When she discontinued taking them and went back to taking her old medication her headaches have stopped. Pt wants to know if she can she just contiunue to take the medication she used to take and discontinue the new medication?  Ph# [phone]

## 2020-04-18 NOTE — Telephone Encounter (Signed)
We can go back to the propranolol.  However, the extended-release version does not come in a small enough size for your current needs.  OK for BID version?

## 2020-04-18 NOTE — Telephone Encounter (Signed)
Please Advise

## 2020-04-19 MED ORDER — PROPRANOLOL HCL 10 MG PO TABS: 10.0000 mg | ORAL_TABLET | Freq: Three times a day (TID) | ORAL | 2 refills | Status: DC

## 2020-04-19 NOTE — Telephone Encounter (Signed)
Patient returned Shelley Cunningham's call. Patient states she prefers to stay on Propranolol and not take the metoprolol due to metoprolol gives patient very bad headaches. If questions , Patient requests to be called at ph# [phone].

## 2020-04-19 NOTE — Telephone Encounter (Signed)
LVM for pt to contact the office regarding her message that she left yesterday. I also LVM with Ellison's recommendations

## 2020-04-19 NOTE — Addendum Note (Signed)
Addended by: Renato Shin on: 04/19/2020 04:28 PM   Modules accepted: Orders

## 2020-04-19 NOTE — Telephone Encounter (Signed)
Pt is ok with doing the Propranolol

## 2020-05-12 ENCOUNTER — Institutional Professional Consult (permissible substitution): Payer: 59 | Admitting: Plastic Surgery

## 2020-05-12 ENCOUNTER — Ambulatory Visit: Payer: 59 | Admitting: Endocrinology

## 2020-06-05 ENCOUNTER — Emergency Department (HOSPITAL_COMMUNITY): Payer: 59

## 2020-06-05 ENCOUNTER — Other Ambulatory Visit: Payer: Self-pay

## 2020-06-05 ENCOUNTER — Inpatient Hospital Stay (HOSPITAL_COMMUNITY)
Admission: EM | Admit: 2020-06-05 | Discharge: 2020-06-09 | DRG: 645 | Disposition: A | Discharge status: Home / Self Care | Payer: 59 | Attending: Family Medicine | Admitting: Family Medicine

## 2020-06-05 DIAGNOSIS — F419 Anxiety disorder, unspecified: Secondary | ICD-10-CM | POA: Diagnosis present

## 2020-06-05 DIAGNOSIS — E061 Subacute thyroiditis: Principal | ICD-10-CM | POA: Diagnosis present

## 2020-06-05 DIAGNOSIS — J45909 Unspecified asthma, uncomplicated: Secondary | ICD-10-CM | POA: Diagnosis present

## 2020-06-05 DIAGNOSIS — E010 Iodine-deficiency related diffuse (endemic) goiter: Secondary | ICD-10-CM

## 2020-06-05 DIAGNOSIS — I10 Essential (primary) hypertension: Secondary | ICD-10-CM | POA: Diagnosis present

## 2020-06-05 DIAGNOSIS — Z20822 Contact with and (suspected) exposure to covid-19: Secondary | ICD-10-CM | POA: Diagnosis present

## 2020-06-05 DIAGNOSIS — J029 Acute pharyngitis, unspecified: Secondary | ICD-10-CM | POA: Diagnosis present

## 2020-06-05 DIAGNOSIS — Z79899 Other long term (current) drug therapy: Secondary | ICD-10-CM

## 2020-06-05 DIAGNOSIS — E069 Thyroiditis, unspecified: Secondary | ICD-10-CM

## 2020-06-05 DIAGNOSIS — Z881 Allergy status to other antibiotic agents status: Secondary | ICD-10-CM

## 2020-06-05 DIAGNOSIS — F32A Depression, unspecified: Secondary | ICD-10-CM | POA: Diagnosis present

## 2020-06-05 DIAGNOSIS — E0520 Thyrotoxicosis with toxic multinodular goiter without thyrotoxic crisis or storm: Secondary | ICD-10-CM | POA: Diagnosis present

## 2020-06-05 DIAGNOSIS — E0590 Thyrotoxicosis, unspecified without thyrotoxic crisis or storm: Secondary | ICD-10-CM | POA: Diagnosis present

## 2020-06-05 DIAGNOSIS — R001 Bradycardia, unspecified: Secondary | ICD-10-CM | POA: Diagnosis not present

## 2020-06-05 HISTORY — DX: Thyrotoxicosis, unspecified without thyrotoxic crisis or storm: E05.90

## 2020-06-05 HISTORY — DX: Depression, unspecified: F32.A

## 2020-06-05 HISTORY — DX: Anxiety disorder, unspecified: F41.9

## 2020-06-05 HISTORY — DX: Essential (primary) hypertension: I10

## 2020-06-05 LAB — CBC WITH DIFFERENTIAL/PLATELET
Abs Immature Granulocytes: 0.01 10*3/uL (ref 0.00–0.07)
Basophils Absolute: 0 10*3/uL (ref 0.0–0.1)
Basophils Relative: 0 %
Eosinophils Absolute: 0 10*3/uL (ref 0.0–0.5)
Eosinophils Relative: 0 %
HCT: 42.2 % (ref 36.0–46.0)
Hemoglobin: 14.1 g/dL (ref 12.0–15.0)
Immature Granulocytes: 0 %
Lymphocytes Relative: 44 %
Lymphs Abs: 2.2 10*3/uL (ref 0.7–4.0)
MCH: 29.4 pg (ref 26.0–34.0)
MCHC: 33.4 g/dL (ref 30.0–36.0)
MCV: 88.1 fL (ref 80.0–100.0)
Monocytes Absolute: 0.4 10*3/uL (ref 0.1–1.0)
Monocytes Relative: 7 %
Neutro Abs: 2.4 10*3/uL (ref 1.7–7.7)
Neutrophils Relative %: 49 %
Platelets: 203 10*3/uL (ref 150–400)
RBC: 4.79 MIL/uL (ref 3.87–5.11)
RDW: 15.5 % (ref 11.5–15.5)
WBC: 5 10*3/uL (ref 4.0–10.5)
nRBC: 0 % (ref 0.0–0.2)

## 2020-06-05 LAB — BASIC METABOLIC PANEL
Anion gap: 10 (ref 5–15)
BUN: 10 mg/dL (ref 6–20)
CO2: 23 mmol/L (ref 22–32)
Calcium: 9.2 mg/dL (ref 8.9–10.3)
Chloride: 102 mmol/L (ref 98–111)
Creatinine, Ser: 0.96 mg/dL (ref 0.44–1.00)
GFR, Estimated: >60 mL/min (ref >60)
Glucose, Bld: 106 mg/dL — ABNORMAL HIGH (ref 70–99)
Potassium: 4 mmol/L (ref 3.5–5.1)
Sodium: 135 mmol/L (ref 135–145)

## 2020-06-05 LAB — T4, FREE: Free T4: <0.25 ng/dL — ABNORMAL LOW (ref 0.61–1.12)

## 2020-06-05 LAB — TSH: TSH: 42.329 u[IU]/mL — ABNORMAL HIGH (ref 0.350–4.500)

## 2020-06-05 IMAGING — CT CT NECK W/ CM
3 of 4 series · 12 of 33 positions shown, 14 images · IV contrast (Omni 300)
Comparison: Prior thyroid ultrasound from [DATE].

CLINICAL DATA: Initial evaluation for acute neck mass and swelling
for 5 days, difficulty swallowing.

EXAM:
CT NECK WITH CONTRAST
TECHNIQUE: Multidetector CT imaging of the neck was performed using the
standard protocol following the bolus administration of intravenous
contrast.
CONTRAST:  75mL OMNIPAQUE IOHEXOL 300 MG/ML  SOLN

[Series 5: sagittal · sagittal · 0.45mm/px · 5 of 76 slices shown, 6 images]
[im 26/76  bone]
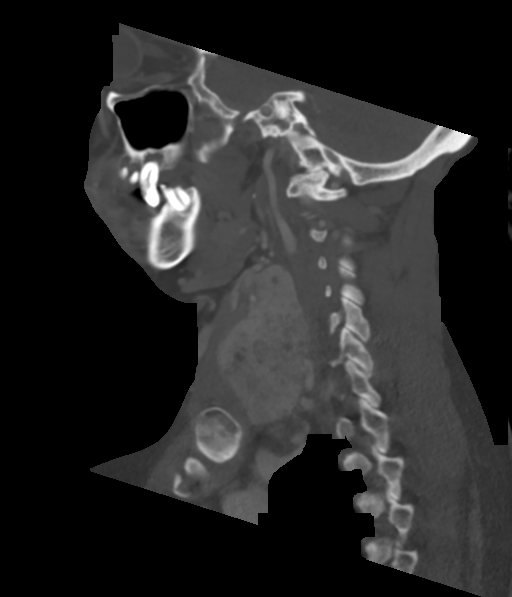
[im 32/76  bone]
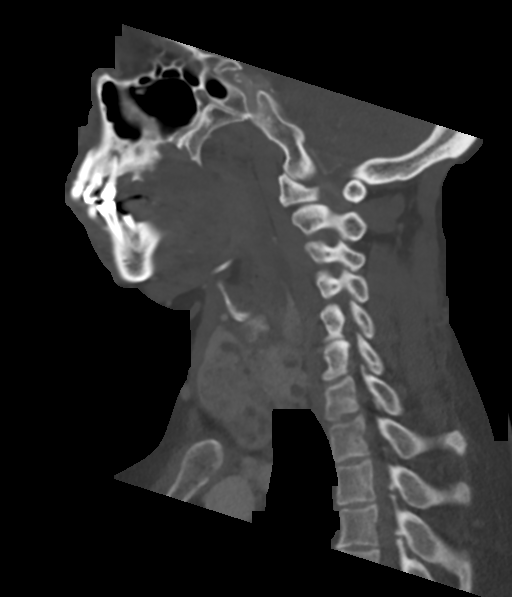
[im 38/76  soft-tissue]
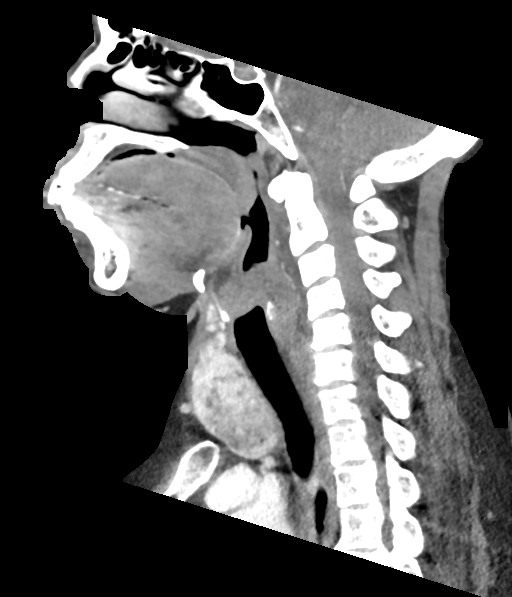
[im 38/76  bone]
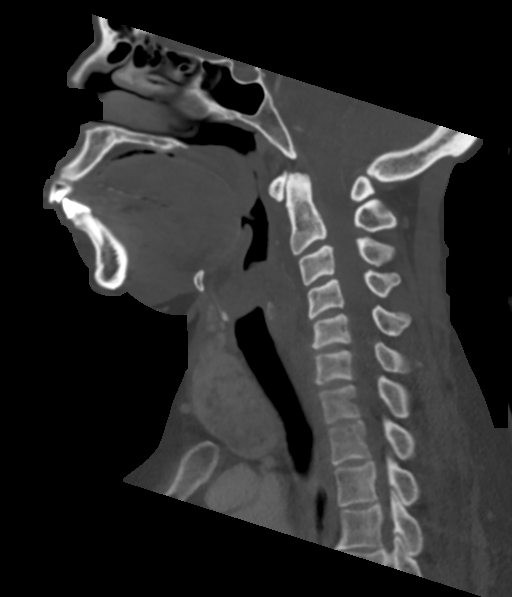
[im 44/76  bone]
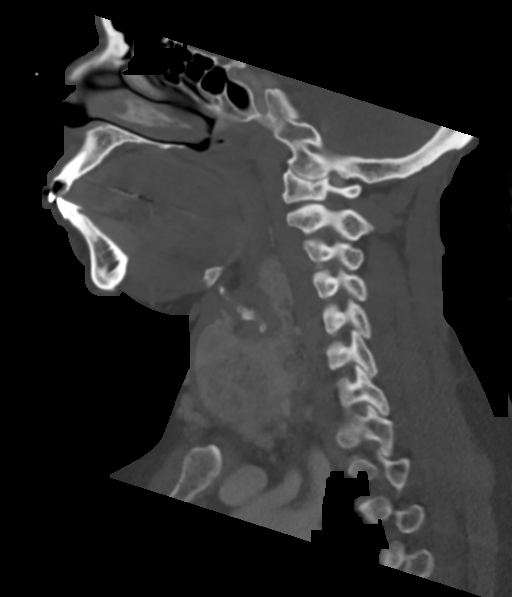
[im 51/76  bone]
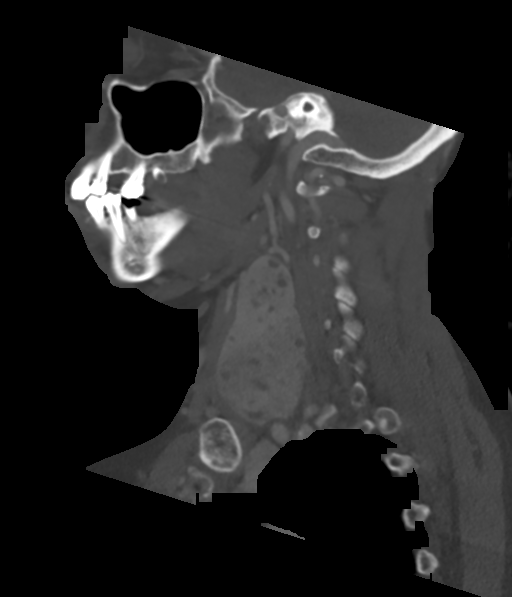

[Series 6: coronal · coronal · 0.36mm/px · 3 of 98 slices shown]
[im 33/98  bone]
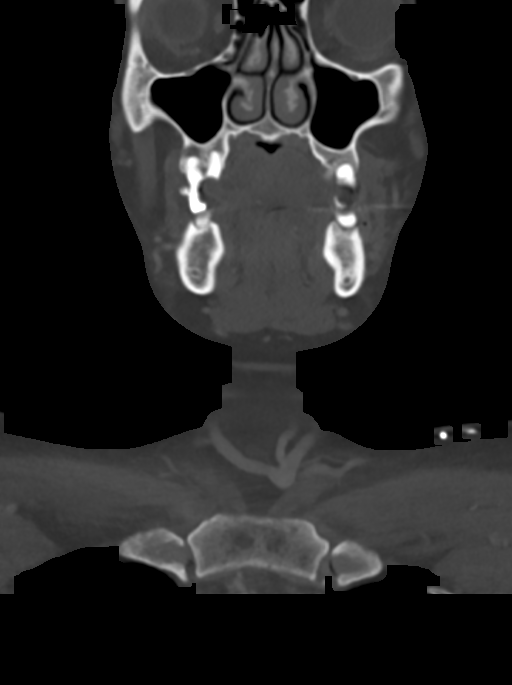
[im 44/98  bone]
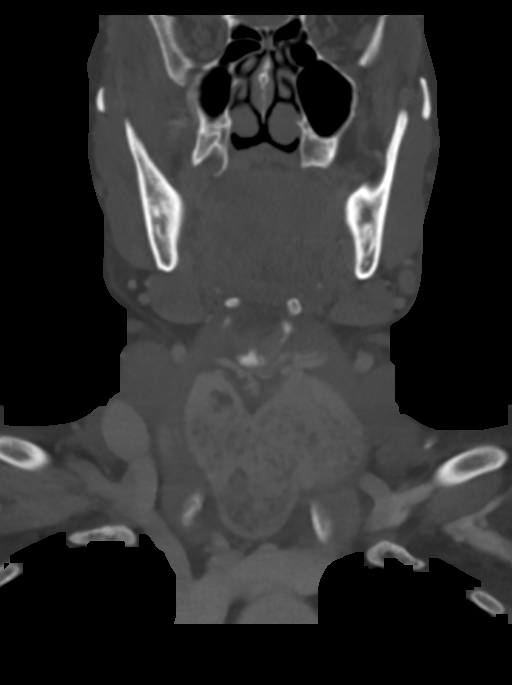
[im 55/98  bone]
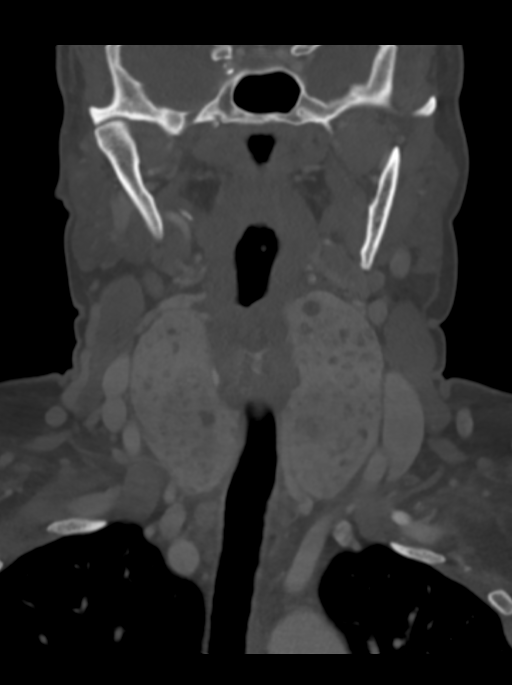

[Series 7: orthogonal · axial · 0.34mm/px · z∈[+888,+1038]mm · 4 of 117 slices shown, 5 images]
[im 17/117  soft-tissue]
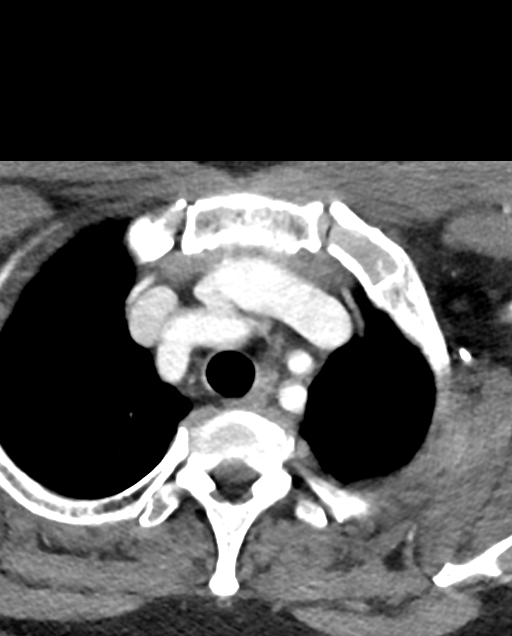
[im 17/117  bone]
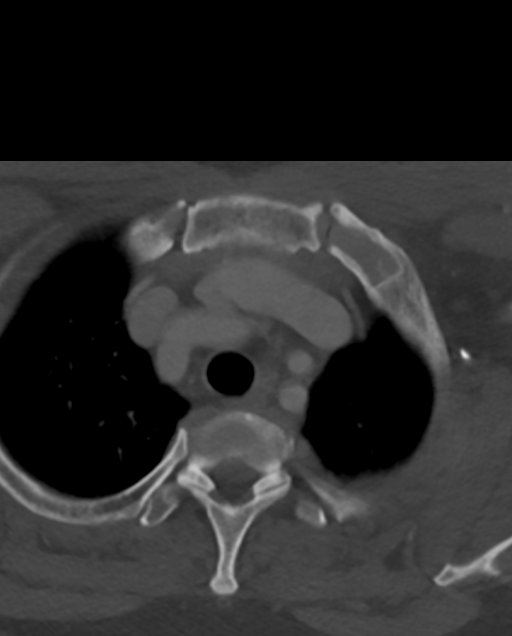
[im 50/117  bone]
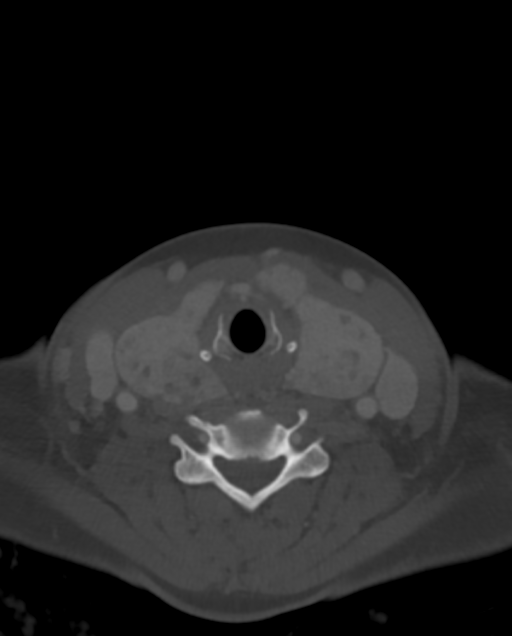
[im 67/117  bone]
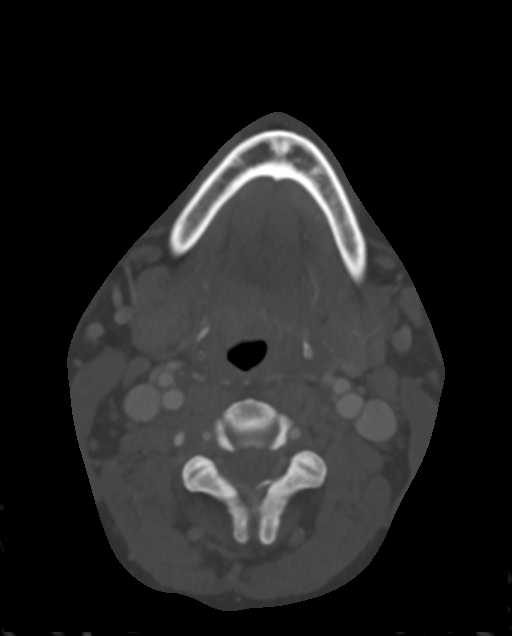
[im 100/117  bone]
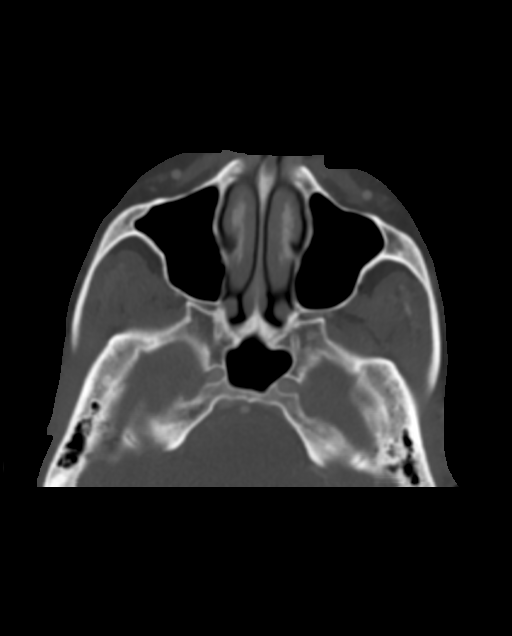

[12 of 33 positions shown; findings below may reference images not displayed]

FINDINGS: Pharynx and larynx: Oral cavity within normal limits without
discrete mass or collection. Multiple scattered dental caries
present, with no acute inflammatory changes seen about the teeth.
Palatine tonsils symmetric and within normal limits. Nasopharynx
normal. Mild diffuse mucosal edema seen within the oropharynx,
extending inferiorly to involve the supraglottic larynx and pharynx.
Findings suggestive of acute pharyngitis. Associated small layering
retropharyngeal effusion, likely reactive. Epiglottis itself within
normal limits without evidence for associated supraglottitis.
Secondary mild narrowing of the supraglottic airway which remains
patent at this time. Glottis is closed and not well assessed.
Subglottic airway patent and clear. No discrete abscess or drainable
fluid collection.

Salivary glands: Salivary glands including the parotid and
submandibular glands are within normal limits.

Thyroid: Thyroid gland is markedly enlarged with heterogeneous
multinodular appearance, suggesting multinodular goiter. This has
been evaluated on previous imaging. (ref: [HOSPITAL]. [DATE]): 143-50).

Lymph nodes: No enlarged or pathologic adenopathy seen within the
neck.

Vascular: Normal intravascular enhancement seen throughout the neck.
Both carotid arteries are splayed around the enlarged thyroid.

Limited intracranial: Unremarkable.

Visualized orbits: Unremarkable.

Mastoids and visualized paranasal sinuses: Visualized paranasal
sinuses are clear. Visualized mastoids and middle ear cavities are
well pneumatized and free of fluid.

Skeleton: No acute osseous finding. No discrete or worrisome osseous
lesions.

Upper chest: Visualized upper chest demonstrates no acute finding.
Emphysematous changes noted within the visualized lungs.

Other: None.
IMPRESSION: 1. Mild diffuse mucosal edema within the pharynx, suggesting acute
pharyngitis. Associated small layering retropharyngeal effusion,
likely reactive. No discrete abscess or drainable fluid collection.
Secondary mild narrowing of the supraglottic airway which remains
patent at this time.
2. Markedly enlarged and heterogeneous multinodular thyroid gland,
suggesting multinodular goiter. This has been evaluated on previous
imaging.
3. Poor dentition without acute inflammatory changes.
4. Emphysema ([HS]-[HS]).

## 2020-06-05 MED ORDER — IOHEXOL 300 MG/ML  SOLN
75.0000 mL | Freq: Once | INTRAMUSCULAR | Status: AC | PRN
  Administered 2020-06-05: 75 mL via INTRAVENOUS

## 2020-06-05 NOTE — ED Triage Notes (Signed)
Emergency Medicine Provider Triage Evaluation Note  Shelley Cunningham , a 55 y.o. female  was evaluated in triage.  Pt complains of neck mass that has been present for the past few weeks. Neck edema associated with difficulties swallowing and breathing. History of hyperthyroidism. No fever or chills.  Review of Systems  Positive: Neck pain Negative: fever  Physical Exam  There were no vitals taken for this visit. Gen:   Awake, no distress   HEENT:  Atraumatic, anterior neck mass Resp:  Normal effort, no stridor or wheeze Cardiac:  Normal rate  Abd:   Nondistended, nontender  MSK:   Moves extremities without difficulty  Neuro:  Speech clear  Medical Decision Making  Medically screening exam initiated at 6:04 PM.  Appropriate orders placed.  Jaxon Mynhier was informed that the remainder of the evaluation will be completed by another provider, this initial triage assessment does not replace that evaluation, and the importance of remaining in the ED until their evaluation is complete.  Clinical Impression  Anterior neck mass. Labs, TSH/T4, and CT neck ordered.   Suzy Bouchard, [place] 06/05/20 1810

## 2020-06-05 NOTE — ED Triage Notes (Signed)
Pt with hx of hyperthyroidism, here for eval of multiple complaints: anterior neck swelling x several weeks but worsening since Thursday. Also reports shaking, feeling hot, having high blood pressure. Reports alteration in voice d/t throat swelling and difficulty swallowing with minor shob.

## 2020-06-06 ENCOUNTER — Encounter (HOSPITAL_COMMUNITY): Payer: Self-pay | Admitting: Family Medicine

## 2020-06-06 DIAGNOSIS — E069 Thyroiditis, unspecified: Secondary | ICD-10-CM | POA: Diagnosis not present

## 2020-06-06 DIAGNOSIS — J029 Acute pharyngitis, unspecified: Secondary | ICD-10-CM

## 2020-06-06 DIAGNOSIS — E061 Subacute thyroiditis: Secondary | ICD-10-CM

## 2020-06-06 DIAGNOSIS — I10 Essential (primary) hypertension: Secondary | ICD-10-CM | POA: Diagnosis present

## 2020-06-06 DIAGNOSIS — J45909 Unspecified asthma, uncomplicated: Secondary | ICD-10-CM | POA: Diagnosis present

## 2020-06-06 DIAGNOSIS — Z20822 Contact with and (suspected) exposure to covid-19: Secondary | ICD-10-CM | POA: Diagnosis present

## 2020-06-06 DIAGNOSIS — Z79899 Other long term (current) drug therapy: Secondary | ICD-10-CM | POA: Diagnosis not present

## 2020-06-06 DIAGNOSIS — F419 Anxiety disorder, unspecified: Secondary | ICD-10-CM | POA: Diagnosis present

## 2020-06-06 DIAGNOSIS — F32A Depression, unspecified: Secondary | ICD-10-CM | POA: Diagnosis present

## 2020-06-06 DIAGNOSIS — R001 Bradycardia, unspecified: Secondary | ICD-10-CM | POA: Diagnosis not present

## 2020-06-06 DIAGNOSIS — E010 Iodine-deficiency related diffuse (endemic) goiter: Secondary | ICD-10-CM | POA: Diagnosis present

## 2020-06-06 DIAGNOSIS — E0520 Thyrotoxicosis with toxic multinodular goiter without thyrotoxic crisis or storm: Secondary | ICD-10-CM | POA: Diagnosis present

## 2020-06-06 DIAGNOSIS — Z881 Allergy status to other antibiotic agents status: Secondary | ICD-10-CM | POA: Diagnosis not present

## 2020-06-06 LAB — GROUP A STREP BY PCR: Group A Strep by PCR: NOT DETECTED

## 2020-06-06 LAB — CBC
HCT: 42.5 % (ref 36.0–46.0)
Hemoglobin: 14.4 g/dL (ref 12.0–15.0)
MCH: 29.7 pg (ref 26.0–34.0)
MCHC: 33.9 g/dL (ref 30.0–36.0)
MCV: 87.6 fL (ref 80.0–100.0)
Platelets: 164 10*3/uL (ref 150–400)
RBC: 4.85 MIL/uL (ref 3.87–5.11)
RDW: 15.9 % — ABNORMAL HIGH (ref 11.5–15.5)
WBC: 5.3 10*3/uL (ref 4.0–10.5)
nRBC: 0 % (ref 0.0–0.2)

## 2020-06-06 LAB — RESPIRATORY PANEL BY PCR

## 2020-06-06 LAB — BASIC METABOLIC PANEL
Anion gap: 9 (ref 5–15)
BUN: 9 mg/dL (ref 6–20)
CO2: 23 mmol/L (ref 22–32)
Calcium: 9.1 mg/dL (ref 8.9–10.3)
Chloride: 104 mmol/L (ref 98–111)
Creatinine, Ser: 0.85 mg/dL (ref 0.44–1.00)
GFR, Estimated: >60 mL/min (ref >60)
Glucose, Bld: 108 mg/dL — ABNORMAL HIGH (ref 70–99)
Potassium: 3.6 mmol/L (ref 3.5–5.1)
Sodium: 136 mmol/L (ref 135–145)

## 2020-06-06 LAB — RESP PANEL BY RT-PCR (FLU A&B, COVID) ARPGX2
Influenza A by PCR: NEGATIVE
Influenza B by PCR: NEGATIVE
SARS Coronavirus 2 by RT PCR: NEGATIVE

## 2020-06-06 LAB — HIV ANTIBODY (ROUTINE TESTING W REFLEX): HIV Screen 4th Generation wRfx: NONREACTIVE

## 2020-06-06 MED ORDER — TRAMADOL HCL 50 MG PO TABS
50.0000 mg | ORAL_TABLET | Freq: Four times a day (QID) | ORAL | Status: DC | PRN
  Administered 2020-06-06: 50 mg via ORAL
  Filled 2020-06-06: qty 1

## 2020-06-06 MED ORDER — PROPRANOLOL HCL 10 MG PO TABS
10.0000 mg | ORAL_TABLET | Freq: Three times a day (TID) | ORAL | Status: DC
  Filled 2020-06-06: qty 1

## 2020-06-06 MED ORDER — ACETAMINOPHEN 650 MG RE SUPP: 650.0000 mg | Freq: Four times a day (QID) | RECTAL | Status: DC | PRN

## 2020-06-06 MED ORDER — MORPHINE SULFATE (PF) 2 MG/ML IV SOLN
1.0000 mg | Freq: Once | INTRAVENOUS | Status: DC
Start: 2020-06-06 — End: 2020-06-08

## 2020-06-06 MED ORDER — ACETAMINOPHEN 325 MG PO TABS
650.0000 mg | ORAL_TABLET | Freq: Four times a day (QID) | ORAL | Status: DC | PRN
  Administered 2020-06-06: 650 mg via ORAL
  Filled 2020-06-06: qty 2

## 2020-06-06 MED ORDER — CLINDAMYCIN PHOSPHATE 600 MG/50ML IV SOLN
600.0000 mg | Freq: Three times a day (TID) | INTRAVENOUS | Status: DC
  Administered 2020-06-06 – 2020-06-09 (×11): 600 mg via INTRAVENOUS
  Filled 2020-06-06 (×13): qty 50

## 2020-06-06 MED ORDER — METHIMAZOLE 10 MG PO TABS
10.0000 mg | ORAL_TABLET | Freq: Three times a day (TID) | ORAL | Status: DC
Start: 2020-06-06 — End: 2020-06-06
  Administered 2020-06-06: 10 mg via ORAL
  Filled 2020-06-06 (×2): qty 1

## 2020-06-06 MED ORDER — ONDANSETRON HCL 4 MG/2ML IJ SOLN
4.0000 mg | Freq: Four times a day (QID) | INTRAMUSCULAR | Status: DC | PRN
  Administered 2020-06-06 – 2020-06-09 (×9): 4 mg via INTRAVENOUS
  Filled 2020-06-06 (×9): qty 2

## 2020-06-06 MED ORDER — SENNOSIDES-DOCUSATE SODIUM 8.6-50 MG PO TABS: 1.0000 | ORAL_TABLET | Freq: Every evening | ORAL | Status: DC | PRN

## 2020-06-06 MED ORDER — FENTANYL CITRATE (PF) 100 MCG/2ML IJ SOLN
100.0000 ug | Freq: Once | INTRAMUSCULAR | Status: AC
  Administered 2020-06-06: 100 ug via INTRAVENOUS
  Filled 2020-06-06: qty 2

## 2020-06-06 MED ORDER — ONDANSETRON HCL 4 MG/2ML IJ SOLN
4.0000 mg | Freq: Once | INTRAMUSCULAR | Status: AC
  Administered 2020-06-06: 4 mg via INTRAVENOUS
  Filled 2020-06-06: qty 2

## 2020-06-06 MED ORDER — PROPRANOLOL HCL 40 MG PO TABS
20.0000 mg | ORAL_TABLET | Freq: Three times a day (TID) | ORAL | Status: DC
  Filled 2020-06-06: qty 1

## 2020-06-06 MED ORDER — DEXAMETHASONE SODIUM PHOSPHATE 10 MG/ML IJ SOLN
10.0000 mg | Freq: Once | INTRAMUSCULAR | Status: AC
  Administered 2020-06-06: 10 mg via INTRAVENOUS
  Filled 2020-06-06 (×2): qty 1

## 2020-06-06 MED ORDER — CLINDAMYCIN PHOSPHATE 600 MG/50ML IV SOLN
600.0000 mg | Freq: Once | INTRAVENOUS | Status: AC
  Administered 2020-06-06: 600 mg via INTRAVENOUS
  Filled 2020-06-06: qty 50

## 2020-06-06 MED ORDER — AMLODIPINE BESYLATE 5 MG PO TABS
5.0000 mg | ORAL_TABLET | Freq: Every day | ORAL | Status: DC
  Administered 2020-06-06 – 2020-06-09 (×4): 5 mg via ORAL
  Filled 2020-06-06 (×4): qty 1

## 2020-06-06 MED ORDER — MORPHINE SULFATE (PF) 2 MG/ML IV SOLN: 1.0000 mg | INTRAVENOUS | Status: DC | PRN

## 2020-06-06 MED ORDER — SODIUM CHLORIDE 0.9 % IV SOLN: INTRAVENOUS | Status: DC

## 2020-06-06 MED ORDER — TRAMADOL HCL 50 MG PO TABS
100.0000 mg | ORAL_TABLET | Freq: Four times a day (QID) | ORAL | Status: DC | PRN
  Administered 2020-06-06 – 2020-06-08 (×3): 100 mg via ORAL
  Filled 2020-06-06 (×4): qty 2

## 2020-06-06 MED ORDER — CLONAZEPAM 0.25 MG PO TBDP: 0.2500 mg | ORAL_TABLET | Freq: Two times a day (BID) | ORAL | Status: DC | PRN

## 2020-06-06 NOTE — Progress Notes (Signed)
PROGRESS NOTE    Shelley Cunningham  SNK:[id number] DOB: [birth year] DOA: 06/05/2020 PCP: Elenore Paddy, NP    Chief Complaint  Patient presents with  . Neck Pain    Brief Narrative:  Pt admitted earlier this am by Dr Tonie Griffith, please see note for detailed H&P.    Shelley Cunningham is a 55 y.o. female with medical history significant for HTN, hyperthyroidism and mild asthma who presents for evaluation of increasing mass in neck with change in her voice and pain with swallowing. She is on methimazole and is s/p RAI for hyperthyroidism.    CT scan showed diffuse mucosal edema of the pharynx consistent with acute pharyngitis.  She also has a small retropharyngeal effusion but no discrete or drainable fluid collection.  He states there is mild narrowing of the supraglottal airway but it is patent.  She has an enlarged heterogeneous multinodular thyroid gland on CT.  ER physician discussed with on-call ENT who recommended patient be admitted to the hospital service for further monitoring and work-up.    Assessment & Plan:   Principal Problem:   Thyroiditis Active Problems:   Hyperthyroidism   Essential hypertension   Pharyngitis   Acute Pharyngitis in the setting of hypothyroidism and methimazole use:  - discussed with her endocrinologist, who recommended stopping the methimazole and follow up with him in 2 weeks.  - meanwhile treat with clindamycin, soft diet and gentle hydration.     Hypertension;  Sub optimally controlled, added hydralazine prn and norvasc 5 mg daily.    H/o Multinodular goiter s/p RAI treatment in November with concomitant use of methimazole. As per Dr Loanne Drilling she was supposed to follow upw ith on April 1 st but missed the appointment and continued to take methimazole.  In view of her infection and abnormal thyroid panel, stop methimazole.    intentional tremors not responding to Inderal.  Started on clonazepam    DVT prophylaxis: (Lovenox) Code Status:  full code.  Family Communication: none at bedside.  Disposition:   Status is: Inpatient  Remains inpatient appropriate because:IV treatments appropriate due to intensity of illness or inability to take PO   Dispo: The patient is from: Home              Anticipated d/c is to: Home              Patient currently is not medically stable to d/c.   Difficult to place patient No       Consultants:   Discussed with Dr Loanne Drilling over the phone.    Procedures: none.   Antimicrobials:  Antibiotics Given (last 72 hours)    Date/Time Action Medication Dose Rate   06/06/20 0033 New Bag/Given   clindamycin (CLEOCIN) IVPB 600 mg 600 mg 100 mL/hr   06/06/20 0536 New Bag/Given   clindamycin (CLEOCIN) IVPB 600 mg 600 mg 100 mL/hr          Subjective: Throat hurts, no chest pain or sob.   Objective: Vitals:   06/06/20 0500 06/06/20 0728 06/06/20 1134 06/06/20 1143  BP: (!) 146/77 (!) 143/77 (!) 168/91 (!) 168/91  Pulse: (!) 59 62 (!) 53 (!) 56  Resp: 20 18 12 16   Temp:  98.2 F (36.8 C) 97.8 F (36.6 C)   TempSrc:  Oral Oral   SpO2: 100% 100% 100% 100%  Weight:  76.2 kg    Height:  5\' 4"  (1.626 m)      Intake/Output Summary (Last 24 hours) at 06/06/2020 1303  Last data filed at 06/06/2020 8657 Gross per 24 hour  Intake 85.69 ml  Output --  Net 85.69 ml   Filed Weights   06/06/20 0728  Weight: 76.2 kg    Examination:  General exam: elderly gentleman with neck swelling,  Respiratory system: Clear to auscultation. Respiratory effort normal. Cardiovascular system: S1 & S2 heard, RRR. No JVD, No pedal edema. Gastrointestinal system: Abdomen is nondistended, soft and nontender.. Normal bowel sounds heard. Central nervous system: Alert and oriented. No focal neurological deficits. Extremities: Symmetric 5 x 5 power. Skin: No rashes, lesions or ulcers Psychiatry: . Mood & affect appropriate.     Data Reviewed: I have personally reviewed following labs and imaging  studies  CBC: Recent Labs  Lab 06/05/20 1808 06/06/20 0442  WBC 5.0 5.3  NEUTROABS 2.4  --   HGB 14.1 14.4  HCT 42.2 42.5  MCV 88.1 87.6  PLT 203 846    Basic Metabolic Panel: Recent Labs  Lab 06/05/20 1808 06/06/20 0635  NA 135 136  K 4.0 3.6  CL 102 104  CO2 23 23  GLUCOSE 106* 108*  BUN 10 9  CREATININE 0.96 0.85  CALCIUM 9.2 9.1    GFR: Estimated Creatinine Clearance: 77.4 mL/min (by C-G formula based on SCr of 0.85 mg/dL).  Liver Function Tests: No results for input(s): AST, ALT, ALKPHOS, BILITOT, PROT, ALBUMIN in the last 168 hours.  CBG: No results for input(s): GLUCAP in the last 168 hours.   Recent Results (from the past 240 hour(s))  Resp Panel by RT-PCR (Flu A&B, Covid) Nasopharyngeal Swab     Status: None   Collection Time: 06/06/20  5:45 AM   Specimen: Nasopharyngeal Swab; Nasopharyngeal(NP) swabs in vial transport medium  Result Value Ref Range Status   SARS Coronavirus 2 by RT PCR NEGATIVE NEGATIVE Final    Comment: (NOTE) SARS-CoV-2 target nucleic acids are NOT DETECTED.  The SARS-CoV-2 RNA is generally detectable in upper respiratory specimens during the acute phase of infection. The lowest concentration of SARS-CoV-2 viral copies this assay can detect is 138 copies/mL. A negative result does not preclude SARS-Cov-2 infection and should not be used as the sole basis for treatment or other patient management decisions. A negative result may occur with  improper specimen collection/handling, submission of specimen other than nasopharyngeal swab, presence of viral mutation(s) within the areas targeted by this assay, and inadequate number of viral copies(<138 copies/mL). A negative result must be combined with clinical observations, patient history, and epidemiological information. The expected result is Negative.  Fact Sheet for Patients:  EntrepreneurPulse.com.au  Fact Sheet for Healthcare Providers:   IncredibleEmployment.be  This test is no t yet approved or cleared by the [place] FDA and  has been authorized for detection and/or diagnosis of SARS-CoV-2 by FDA under an Emergency Use Authorization (EUA). This EUA will remain  in effect (meaning this test can be used) for the duration of the COVID-19 declaration under Section 564(b)(1) of the Act, 21 U.S.C.section 360bbb-3(b)(1), unless the authorization is terminated  or revoked sooner.       Influenza A by PCR NEGATIVE NEGATIVE Final   Influenza B by PCR NEGATIVE NEGATIVE Final    Comment: (NOTE) The Xpert Xpress SARS-CoV-2/FLU/RSV plus assay is intended as an aid in the diagnosis of influenza from Nasopharyngeal swab specimens and should not be used as a sole basis for treatment. Nasal washings and aspirates are unacceptable for Xpert Xpress SARS-CoV-2/FLU/RSV testing.  Fact Sheet for Patients: EntrepreneurPulse.com.au  Fact  Sheet for Healthcare Providers: IncredibleEmployment.be  This test is not yet approved or cleared by the [place] and has been authorized for detection and/or diagnosis of SARS-CoV-2 by FDA under an Emergency Use Authorization (EUA). This EUA will remain in effect (meaning this test can be used) for the duration of the COVID-19 declaration under Section 564(b)(1) of the Act, 21 U.S.C. section 360bbb-3(b)(1), unless the authorization is terminated or revoked.  Performed at [facility], [place] [address], [place], [place] [address]   Respiratory (~20 pathogens) panel by PCR     Status: None   Collection Time: 06/06/20  5:45 AM   Specimen: Nasopharyngeal Swab; Respiratory  Result Value Ref Range Status   Adenovirus NOT DETECTED NOT DETECTED Final   Coronavirus 229E NOT DETECTED NOT DETECTED Final    Comment: (NOTE) The Coronavirus on the Respiratory Panel, DOES NOT test for the novel  Coronavirus (2019 nCoV)    Coronavirus  HKU1 NOT DETECTED NOT DETECTED Final   Coronavirus NL63 NOT DETECTED NOT DETECTED Final   Coronavirus OC43 NOT DETECTED NOT DETECTED Final   Metapneumovirus NOT DETECTED NOT DETECTED Final   Rhinovirus / Enterovirus NOT DETECTED NOT DETECTED Final   Influenza A NOT DETECTED NOT DETECTED Final   Influenza B NOT DETECTED NOT DETECTED Final   Parainfluenza Virus 1 NOT DETECTED NOT DETECTED Final   Parainfluenza Virus 2 NOT DETECTED NOT DETECTED Final   Parainfluenza Virus 3 NOT DETECTED NOT DETECTED Final   Parainfluenza Virus 4 NOT DETECTED NOT DETECTED Final   Respiratory Syncytial Virus NOT DETECTED NOT DETECTED Final   Bordetella pertussis NOT DETECTED NOT DETECTED Final   Bordetella Parapertussis NOT DETECTED NOT DETECTED Final   Chlamydophila pneumoniae NOT DETECTED NOT DETECTED Final   Mycoplasma pneumoniae NOT DETECTED NOT DETECTED Final    Comment: Performed at [facility], [place]. [address] [place], [place] [address]  Group A Strep by PCR     Status: None   Collection Time: 06/06/20  5:45 AM   Specimen: Throat; Sterile Swab  Result Value Ref Range Status   Group A Strep by PCR NOT DETECTED NOT DETECTED Final    Comment: Performed at [facility], [place] [address] [place], [place] [address]         Radiology Studies: CT Soft Tissue Neck W Contrast  Result Date: 06/05/2020 CLINICAL DATA:  Initial evaluation for acute neck mass and swelling for 5 days, difficulty swallowing. EXAM: CT NECK WITH CONTRAST TECHNIQUE: Multidetector CT imaging of the neck was performed using the standard protocol following the bolus administration of intravenous contrast. CONTRAST:  40mL OMNIPAQUE IOHEXOL 300 MG/ML  SOLN COMPARISON:  Prior thyroid ultrasound from 07/15/2019. FINDINGS: Pharynx and larynx: Oral cavity within normal limits without discrete mass or collection. Multiple scattered dental caries present, with no acute inflammatory changes seen about the teeth. Palatine tonsils  symmetric and within normal limits. Nasopharynx normal. Mild diffuse mucosal edema seen within the oropharynx, extending inferiorly to involve the supraglottic larynx and pharynx. Findings suggestive of acute pharyngitis. Associated small layering retropharyngeal effusion, likely reactive. Epiglottis itself within normal limits without evidence for associated supraglottitis. Secondary mild narrowing of the supraglottic airway which remains patent at this time. Glottis is closed and not well assessed. Subglottic airway patent and clear. No discrete abscess or drainable fluid collection. Salivary glands: Salivary glands including the parotid and submandibular glands are within normal limits. Thyroid: Thyroid gland is markedly enlarged with heterogeneous multinodular appearance, suggesting multinodular  goiter. This has been evaluated on previous imaging. (ref: J Am Coll Radiol. 2015 Feb;12(2): 143-50). Lymph nodes: No enlarged or pathologic adenopathy seen within the neck. Vascular: Normal intravascular enhancement seen throughout the neck. Both carotid arteries are splayed around the enlarged thyroid. Limited intracranial: Unremarkable. Visualized orbits: Unremarkable. Mastoids and visualized paranasal sinuses: Visualized paranasal sinuses are clear. Visualized mastoids and middle ear cavities are well pneumatized and free of fluid. Skeleton: No acute osseous finding. No discrete or worrisome osseous lesions. Upper chest: Visualized upper chest demonstrates no acute finding. Emphysematous changes noted within the visualized lungs. Other: None. IMPRESSION: 1. Mild diffuse mucosal edema within the pharynx, suggesting acute pharyngitis. Associated small layering retropharyngeal effusion, likely reactive. No discrete abscess or drainable fluid collection. Secondary mild narrowing of the supraglottic airway which remains patent at this time. 2. Markedly enlarged and heterogeneous multinodular thyroid gland, suggesting  multinodular goiter. This has been evaluated on previous imaging. 3. Poor dentition without acute inflammatory changes. 4. Emphysema (ICD10-J43.9). Electronically Signed   By: Jeannine Boga M.D.   On: 06/05/2020 20:51        Scheduled Meds: . amLODipine  5 mg Oral Daily   Continuous Infusions: . sodium chloride 100 mL/hr at 06/06/20 0733  . clindamycin (CLEOCIN) IV Stopped (06/06/20 0622)     LOS: 0 days       Hosie Poisson, MD [facility]   To contact the attending provider between 7A-7P or the covering provider during after hours 7P-7A, please log into the web site [email or web address] and access using universal [place] password for that web site. If you do not have the password, please call the hospital operator.  06/06/2020, 1:03 PM

## 2020-06-06 NOTE — Plan of Care (Signed)
  Problem: Clinical Measurements: Goal: Ability to maintain clinical measurements within normal limits will improve Outcome: Progressing   Problem: Clinical Measurements: Goal: Will remain free from infection Outcome: Progressing   Problem: Clinical Measurements: Goal: Ability to maintain clinical measurements within normal limits will improve Outcome: Progressing Goal: Will remain free from infection Outcome: Progressing Goal: Diagnostic test results will improve Outcome: Progressing Goal: Respiratory complications will improve Outcome: Progressing Goal: Cardiovascular complication will be avoided Outcome: Progressing

## 2020-06-06 NOTE — ED Provider Notes (Signed)
[facility] EMERGENCY DEPARTMENT Provider Note   CSN: [id number] Arrival date & time: 06/05/20  1759     History Chief Complaint  Patient presents with  . Neck Pain    Shelley Cunningham is a 55 y.o. female.  The history is provided by the patient.  Neck Pain Quality:  Aching Pain radiates to:  Does not radiate Pain severity:  Moderate Onset quality:  Gradual Duration:  1 week Timing:  Constant Progression:  Worsening Chronicity:  New Relieved by:  Nothing Worsened by:  Bending and position Associated symptoms: no chest pain, no fever and no weakness   Patient with history of hyperthyroid, on methimazole and s/p RAI presents with increasing neck mass and difficulty swallowing for the past week.  Patient reports for approximately 7 days she has had difficulty swallowing, change in her voice, and painful swallowing.  She also reports that her thyroid appears enlarged.  She reports compliance with her methimazole No fevers or vomiting.  She does report cough and sore throat.  No chest pain.  She has had some shortness of breath due to her asthma.     PMH-Hyperthyroid Patient Active Problem List   Diagnosis Date Noted  . Hyperthyroidism 11/26/2019      OB History   No obstetric history on file.     Family History  Problem Relation Age of Onset  . Thyroid disease Neg Hx     Social History   Tobacco Use  . Smoking status: Never Smoker  . Smokeless tobacco: Never Used  Substance Use Topics  . Drug use: Yes    Types: Marijuana    Home Medications Prior to Admission medications   Medication Sig Start Date End Date Taking? Authorizing Provider  methimazole (TAPAZOLE) 10 MG tablet Take 10 mg by mouth every 8 (eight) hours.    [provider]  PARoxetine (PAXIL) 10 MG tablet Take 10 mg by mouth daily.    [provider]  propranolol (INDERAL) 10 MG tablet Take 1 tablet (10 mg total) by mouth 3 (three) times daily. 04/19/20    Renato Shin, MD  venlafaxine (EFFEXOR) 37.5 MG tablet Take 37.5 mg by mouth daily.    [provider]    Allergies    Flagyl [metronidazole]  Review of Systems   Review of Systems  Constitutional: Negative for fever.  HENT: Positive for trouble swallowing and voice change. Negative for drooling.   Respiratory: Positive for cough and shortness of breath.   Cardiovascular: Negative for chest pain.  Musculoskeletal: Positive for neck pain.  Neurological: Negative for weakness.  Psychiatric/Behavioral: Positive for sleep disturbance.  All other systems reviewed and are negative.   Physical Exam Updated Vital Signs BP (!) 167/92 (BP Location: Right Arm)   Pulse (!) 54   Temp 98.3 F (36.8 C) (Oral)   Resp 18   SpO2 100%   Physical Exam CONSTITUTIONAL: Well developed/well nourished HEAD: Normocephalic/atraumatic EYES: EOMI/PERRL ENMT: Mucous membranes moist, uvula midline.  No obvious exudates.  No stridor.  Mild dysphonia noted.  No drooling. NECK: supple no meningeal signs, anterior neck masses noted and tender.  No erythema.  No crepitus.  See photo SPINE/BACK:entire spine nontender CV: S1/S2 noted, no murmurs/rubs/gallops noted LUNGS: Lungs are clear to auscultation bilaterally, no apparent distress ABDOMEN: soft, nontender, no rebound or guarding, bowel sounds noted throughout abdomen GU:no cva tenderness NEURO: Pt is awake/alert/appropriate, moves all extremitiesx4.  No facial droop.   EXTREMITIES: pulses normal/equal, full ROM SKIN: warm, color  normal PSYCH: no abnormalities of mood noted, alert and oriented to situation      ED Results / Procedures / Treatments   Labs (all labs ordered are listed, but only abnormal results are displayed) Labs Reviewed  BASIC METABOLIC PANEL - Abnormal; Notable for the following components:      Result Value   Glucose, Bld 106 (*)    All other components within normal limits  T4, FREE - Abnormal; Notable for the  following components:   Free T4 <0.25 (*)    All other components within normal limits  TSH - Abnormal; Notable for the following components:   TSH 42.329 (*)    All other components within normal limits  RESP PANEL BY RT-PCR (FLU A&B, COVID) [id number]  RESPIRATORY PANEL BY PCR  CBC WITH DIFFERENTIAL/PLATELET    EKG None  Radiology CT Soft Tissue Neck W Contrast  Result Date: 06/05/2020 CLINICAL DATA:  Initial evaluation for acute neck mass and swelling for 5 days, difficulty swallowing. EXAM: CT NECK WITH CONTRAST TECHNIQUE: Multidetector CT imaging of the neck was performed using the standard protocol following the bolus administration of intravenous contrast. CONTRAST:  48mL OMNIPAQUE IOHEXOL 300 MG/ML  SOLN COMPARISON:  Prior thyroid ultrasound from 07/15/2019. FINDINGS: Pharynx and larynx: Oral cavity within normal limits without discrete mass or collection. Multiple scattered dental caries present, with no acute inflammatory changes seen about the teeth. Palatine tonsils symmetric and within normal limits. Nasopharynx normal. Mild diffuse mucosal edema seen within the oropharynx, extending inferiorly to involve the supraglottic larynx and pharynx. Findings suggestive of acute pharyngitis. Associated small layering retropharyngeal effusion, likely reactive. Epiglottis itself within normal limits without evidence for associated supraglottitis. Secondary mild narrowing of the supraglottic airway which remains patent at this time. Glottis is closed and not well assessed. Subglottic airway patent and clear. No discrete abscess or drainable fluid collection. Salivary glands: Salivary glands including the parotid and submandibular glands are within normal limits. Thyroid: Thyroid gland is markedly enlarged with heterogeneous multinodular appearance, suggesting multinodular goiter. This has been evaluated on previous imaging. (ref: J Am Coll Radiol. 2015 Feb;12(2): 143-50). Lymph nodes: No enlarged or  pathologic adenopathy seen within the neck. Vascular: Normal intravascular enhancement seen throughout the neck. Both carotid arteries are splayed around the enlarged thyroid. Limited intracranial: Unremarkable. Visualized orbits: Unremarkable. Mastoids and visualized paranasal sinuses: Visualized paranasal sinuses are clear. Visualized mastoids and middle ear cavities are well pneumatized and free of fluid. Skeleton: No acute osseous finding. No discrete or worrisome osseous lesions. Upper chest: Visualized upper chest demonstrates no acute finding. Emphysematous changes noted within the visualized lungs. Other: None. IMPRESSION: 1. Mild diffuse mucosal edema within the pharynx, suggesting acute pharyngitis. Associated small layering retropharyngeal effusion, likely reactive. No discrete abscess or drainable fluid collection. Secondary mild narrowing of the supraglottic airway which remains patent at this time. 2. Markedly enlarged and heterogeneous multinodular thyroid gland, suggesting multinodular goiter. This has been evaluated on previous imaging. 3. Poor dentition without acute inflammatory changes. 4. Emphysema (ICD10-J43.9). Electronically Signed   By: Jeannine Boga M.D.   On: 06/05/2020 20:51    Procedures Procedures   Medications Ordered in ED Medications  ondansetron (ZOFRAN) injection 4 mg (has no administration in time range)  dexamethasone (DECADRON) injection 10 mg (has no administration in time range)  iohexol (OMNIPAQUE) 300 MG/ML solution 75 mL (75 mLs Intravenous Contrast Given 06/05/20 2022)  fentaNYL (SUBLIMAZE) injection 100 mcg (100 mcg Intravenous Given 06/06/20 0033)  clindamycin (CLEOCIN) IVPB 600 mg (  600 mg Intravenous New Bag/Given 06/06/20 0033)    ED Course  I have reviewed the triage vital signs and the nursing notes.  Pertinent labs & imaging results that were available during my care of the patient were reviewed by me and considered in my medical decision  making (see chart for details).    MDM Rules/Calculators/A&P                          1:06 AM Patient presents for evaluation of neck mass and difficulty swallowing.  She reports rapid changes over the past week CT imaging performed prior to my evaluation reveals pharyngitis, ? Retropharyngeal effusion as well as enlarged thyroid.  I discussed these results with Dr. Fredric Dine with otolaryngology Plan will be to admit for IV antibiotics and monitoring. She feels that the findings in the pharynx may be edema.  However it is reasonable to give IV antibiotics and monitor in the hospital.  May also need endocrinology consultation Patient is awake alert this time.  She is protecting her airway.  There is no stridor or signs of respiratory distress. 1:29 AM Discussed with Dr. Tonie Griffith for admission to the hospitalist.  He request to add on rapid viral panel.  He also requests 1 dose of Decadron for edema Final Clinical Impression(s) / ED Diagnoses Final diagnoses:  Acute pharyngitis, unspecified etiology  Thyromegaly    Rx / DC Orders ED Discharge Orders    None       Ripley Fraise, MD 06/06/20 [id number]

## 2020-06-06 NOTE — H&P (Signed)
History and Physical    Shelley Cunningham JOA:[id number] DOB: [birth year] DOA: 06/05/2020  PCP: Elenore Paddy, NP   Patient coming from: Home  Chief Complaint: sore throat and pain with swallowing  HPI: Shelley Cunningham is a 55 y.o. female with medical history significant for HTN, hyperthyroidism and mild asthma who presents for evaluation of increasing mass in neck with change in her voice and pain with swallowing. She is on methimazole and is s/p RAI for hyperthyroidism. She states that a week ago she began to have difficulty with swallowing stating that things just felt like they did not go down easily.  She reports having a sore throat over the last week and has had an enlarging mass in the front of her neck over the week.  She has been compliant with her medications.  She reports she has had chills but no fever.  She does have a dry nonproductive cough and a sore throat.  She states she has not been around anyone who has been sick and has had no contact with any children.  That she feels short of breath when she lays flat secondary to the swelling in her neck.  She has not had excessive drooling she states.  She denies any injury or trauma to her neck.  ED Course: [name] has been hemodynamically stable in the emergency room.  She does have an enlarged thyroid on exam.  CT scan showed diffuse mucosal edema of the pharynx consistent with acute pharyngitis.  She also has a small retropharyngeal effusion but no discrete or drainable fluid collection.  He states there is mild narrowing of the supraglottal airway but it is patent.  She has an enlarged heterogeneous multinodular thyroid gland on CT.  ER physician discussed with on-call ENT who recommended patient be admitted to the hospital service for further monitoring and work-up.  Also recommended dose of antibiotic.  COVID swab has been obtained and is pending. I have also asked for a respiratory viral panel to be run as viral thyroiditis is on  differential diagnosis.  Review of Systems:  General: Reports chills intermittently. Denies  fever, weight loss, night sweats.  Denies dizziness. Reports decreased appetite HENT: Denies head trauma, headache, denies change in hearing, tinnitus.  Denies nasal congestion or bleeding.  Reports sore throat and pain with swallowing. Has hoarse voice. No drooling.  Eyes: Denies blurry vision, pain in eye, drainage.  Denies discoloration of eyes. Neck: Denies pain.  Denies swelling.  Denies pain with movement. Cardiovascular: Denies chest pain, palpitations.  Denies edema.  Denies orthopnea Respiratory: Denies shortness of breath, cough.  Denies wheezing.  Denies sputum production Gastrointestinal: Denies abdominal pain, swelling.  Denies nausea, vomiting, diarrhea.  Denies melena.  Denies hematemesis. Musculoskeletal: Denies limitation of movement.  Denies deformity or swelling.  Denies pain.  Denies arthralgias or myalgias. Genitourinary: Denies pelvic pain.  Denies urinary frequency or hesitancy.  Denies dysuria.  Skin: Denies rash.  Denies petechiae, purpura, ecchymosis. Neurological: Denies headache.  Denies syncope.  Denies seizure activity.  Denies weakness or paresthesia.  Denies slurred speech, drooping face.  Denies visual change. Psychiatric: Denies depression, anxiety.  Denies suicidal thoughts or ideation.  Denies hallucinations.  Past Medical History:  Diagnosis Date  . Anxiety   . Depression   . Hypertension   . Hyperthyroidism     History reviewed. No pertinent surgical history.  Social History  reports that she has never smoked. She has never used smokeless tobacco. She reports current drug use.  Drug: Marijuana. No history on file for alcohol use.  Allergies  Allergen Reactions  . Flagyl [Metronidazole] Nausea And Vomiting    Family History  Problem Relation Age of Onset  . Thyroid disease Neg Hx      Prior to Admission medications   Medication Sig Start Date End  Date Taking? Authorizing Provider  methimazole (TAPAZOLE) 10 MG tablet Take 10 mg by mouth every 8 (eight) hours.    [provider]  PARoxetine (PAXIL) 10 MG tablet Take 10 mg by mouth daily.    [provider]  propranolol (INDERAL) 10 MG tablet Take 1 tablet (10 mg total) by mouth 3 (three) times daily. 04/19/20   Renato Shin, MD  venlafaxine (EFFEXOR) 37.5 MG tablet Take 37.5 mg by mouth daily.    [provider]    Physical Exam: Vitals:   06/05/20 1807 06/05/20 2254  BP: (!) 201/91 (!) 167/92  Pulse: 66 (!) 54  Resp: 18 18  Temp: 98.7 F (37.1 C) 98.3 F (36.8 C)  TempSrc: Oral Oral  SpO2: 98% 100%    Constitutional: NAD, calm, comfortable Vitals:   06/05/20 1807 06/05/20 2254  BP: (!) 201/91 (!) 167/92  Pulse: 66 (!) 54  Resp: 18 18  Temp: 98.7 F (37.1 C) 98.3 F (36.8 C)  TempSrc: Oral Oral  SpO2: 98% 100%   General: WDWN, Alert and oriented x3.  Eyes: EOMI, PERRL, conjunctivae normal.  Sclera nonicteric HENT:  [place]/AT, external ears normal.  Nares patent without epistasis.  Mucous membranes are moist. Posterior pharynx clear of any exudate or lesions. No drooling. Voice hoarse.  Neck: Thyroid enlarged and mildly tender to palpation. Soft, normal range of motion, supple, no masses.  Trachea midline Respiratory: clear to auscultation bilaterally, no wheezing, no crackles. Normal respiratory effort. No accessory muscle use.  Cardiovascular: Regular rate and rhythm, no murmurs / rubs / gallops. No extremity edema. 2+ pedal pulses.  Abdomen: Soft, no tenderness, nondistended, no rebound or guarding.  No masses palpated. Bowel sounds normoactive Musculoskeletal: FROM. no cyanosis. No joint deformity upper and lower extremities.  Normal muscle tone.  Skin: Warm, dry, intact no rashes, lesions, ulcers. No induration Neurologic: CN 2-12 grossly intact. Normal speech. Sensation intact, patella DTR +1 bilaterally. Strength 5/5 in all extremities.    Psychiatric: Normal judgment and insight.  Normal mood.    Labs on Admission: I have personally reviewed following labs and imaging studies  CBC: Recent Labs  Lab 06/05/20 1808  WBC 5.0  NEUTROABS 2.4  HGB 14.1  HCT 42.2  MCV 88.1  PLT 123456    Basic Metabolic Panel: Recent Labs  Lab 06/05/20 1808  NA 135  K 4.0  CL 102  CO2 23  GLUCOSE 106*  BUN 10  CREATININE 0.96  CALCIUM 9.2    GFR: CrCl cannot be calculated (Unknown ideal weight.).  Liver Function Tests: No results for input(s): AST, ALT, ALKPHOS, BILITOT, PROT, ALBUMIN in the last 168 hours.  Urine analysis:    Component Value Date/Time   COLORURINE YELLOW 06/27/2007 0955   APPEARANCEUR CLOUDY (A) 06/27/2007 0955   LABSPEC 1.027 06/27/2007 0955   PHURINE 6.0 06/27/2007 0955   GLUCOSEU NEGATIVE 06/27/2007 0955   HGBUR MODERATE (A) 06/27/2007 0955   BILIRUBINUR NEGATIVE 06/27/2007 0955   KETONESUR 15 (A) 06/27/2007 0955   PROTEINUR NEGATIVE 06/27/2007 0955   UROBILINOGEN 1.0 06/27/2007 0955   NITRITE NEGATIVE 06/27/2007 0955   LEUKOCYTESUR SMALL (A) 06/27/2007 0955    Radiological Exams  on Admission: CT Soft Tissue Neck W Contrast  Result Date: 06/05/2020 CLINICAL DATA:  Initial evaluation for acute neck mass and swelling for 5 days, difficulty swallowing. EXAM: CT NECK WITH CONTRAST TECHNIQUE: Multidetector CT imaging of the neck was performed using the standard protocol following the bolus administration of intravenous contrast. CONTRAST:  70mL OMNIPAQUE IOHEXOL 300 MG/ML  SOLN COMPARISON:  Prior thyroid ultrasound from 07/15/2019. FINDINGS: Pharynx and larynx: Oral cavity within normal limits without discrete mass or collection. Multiple scattered dental caries present, with no acute inflammatory changes seen about the teeth. [place]ine tonsils symmetric and within normal limits. Nasopharynx normal. Mild diffuse mucosal edema seen within the oropharynx, extending inferiorly to involve the supraglottic  larynx and pharynx. Findings suggestive of acute pharyngitis. Associated small layering retropharyngeal effusion, likely reactive. Epiglottis itself within normal limits without evidence for associated supraglottitis. Secondary mild narrowing of the supraglottic airway which remains patent at this time. Glottis is closed and not well assessed. Subglottic airway patent and clear. No discrete abscess or drainable fluid collection. Salivary glands: Salivary glands including the parotid and submandibular glands are within normal limits. Thyroid: Thyroid gland is markedly enlarged with heterogeneous multinodular appearance, suggesting multinodular goiter. This has been evaluated on previous imaging. (ref: J Am Coll Radiol. 2015 Feb;12(2): 143-50). Lymph nodes: No enlarged or pathologic adenopathy seen within the neck. Vascular: Normal intravascular enhancement seen throughout the neck. Both carotid arteries are splayed around the enlarged thyroid. Limited intracranial: Unremarkable. Visualized orbits: Unremarkable. Mastoids and visualized paranasal sinuses: Visualized paranasal sinuses are clear. Visualized mastoids and middle ear cavities are well pneumatized and free of fluid. Skeleton: No acute osseous finding. No discrete or worrisome osseous lesions. Upper chest: Visualized upper chest demonstrates no acute finding. Emphysematous changes noted within the visualized lungs. Other: None. IMPRESSION: 1. Mild diffuse mucosal edema within the pharynx, suggesting acute pharyngitis. Associated small layering retropharyngeal effusion, likely reactive. No discrete abscess or drainable fluid collection. Secondary mild narrowing of the supraglottic airway which remains patent at this time. 2. Markedly enlarged and heterogeneous multinodular thyroid gland, suggesting multinodular goiter. This has been evaluated on previous imaging. 3. Poor dentition without acute inflammatory changes. 4. Emphysema (ICD10-J43.9). Electronically  Signed   By: Jeannine Boga M.D.   On: 06/05/2020 20:51    Assessment/Plan Principal Problem:   Thyroiditis He was admitted to [place] floor.  Patient was started on clindamycin and given Decadron after consultation with the ENT by the ER physician.  Patient is protecting her airway and breathing comfortably. We will need to touch base with patient's endocrinologist in the morning to discuss further treatments and work-up Viral respiratory panel ordered. Viral thyroiditis is on differential   Active Problems:   Hyperthyroidism Continue methimazole. TSH was over 42.     Essential hypertension Continue home dose of propranolol. Monitor BP.     Pharyngitis Check rapid strep and treat if positive.    DVT prophylaxis: [place] score low. TED hose and early ambulation for DVT prophylaxis.  Code Status:   Full code  Family Communication:  Diagnosis and plan discussed with patient.  Patient verbalized understanding and agrees with plan.  Further recommendations to follow as clinically indicated Disposition Plan:   Patient is from:  Home  Anticipated DC to:  Home  Anticipated DC date:  Anticipate 2 midnight or longer stay   Anticipated DC barriers: No barriers to discharge identified at this time  Consults called:  ER physician discussed with ENT-Dr. Fredric Dine Admission status:  Inpatient.  Yevonne Aline Ilayda Toda MD [facility]  How to contact the [facility] Attending or Consulting provider [place] or covering provider during after hours [place], for this patient?   1. Check the care team in [facility] and look for a) attending/consulting [facility] provider listed and b) the [facility] team listed 2. Log into [email or web address] and use [place]'s universal password to access. If you do not have the password, please contact the hospital operator. 3. Locate the [facility] provider you are looking for under [facility] and page to a number that you can be directly reached. 4. If you still have difficulty  reaching the provider, please page the [facility] (Director on Call) for the Hospitalists listed on amion for assistance.  06/06/2020, 2:23 AM

## 2020-06-07 ENCOUNTER — Ambulatory Visit: Payer: 59 | Admitting: Endocrinology

## 2020-06-07 MED ORDER — NAPROXEN 250 MG PO TABS
500.0000 mg | ORAL_TABLET | Freq: Three times a day (TID) | ORAL | Status: DC
  Administered 2020-06-07 – 2020-06-08 (×4): 500 mg via ORAL
  Filled 2020-06-07 (×5): qty 2

## 2020-06-07 MED ORDER — PANTOPRAZOLE SODIUM 40 MG PO TBEC
40.0000 mg | DELAYED_RELEASE_TABLET | Freq: Every day | ORAL | Status: DC
  Administered 2020-06-07 – 2020-06-09 (×3): 40 mg via ORAL
  Filled 2020-06-07 (×3): qty 1

## 2020-06-07 NOTE — Progress Notes (Signed)
Droplet precautions removed per infection prevention.

## 2020-06-07 NOTE — Progress Notes (Addendum)
PROGRESS NOTE    Shelley Cunningham  HFW:[id number] DOB: 10/08/1965 DOA: 06/05/2020 PCP: Elenore Paddy, NP    Chief Complaint  Patient presents with  . Neck Pain    Brief Narrative:  Pt admitted earlier this am by Dr Tonie Griffith, please see note for detailed H&P.    Shelley Cunningham is a 55 y.o. female with medical history significant for HTN, hyperthyroidism and mild asthma who presents for evaluation of increasing mass in neck with change in her voice and pain with swallowing. She is on methimazole and is s/p RAI for hyperthyroidism.    CT scan showed diffuse mucosal edema of the pharynx consistent with acute pharyngitis.  She also has a small retropharyngeal effusion but no discrete or drainable fluid collection.  He states there is mild narrowing of the supraglottal airway but it is patent.  She has an enlarged heterogeneous multinodular thyroid gland on CT.  ER physician discussed with on-call ENT who recommended patient be admitted to the hospital service for further monitoring and work-up.    Assessment & Plan:   Principal Problem:   Thyroiditis Active Problems:   Hyperthyroidism   Essential hypertension   Pharyngitis   Acute Pharyngitis in the setting of hypothyroidism and methimazole use: Strep throat negative.  Still complains of throat pain, continue clindamycin.  History of multinodular goiter status post RAI treatment in November with concomitant use of methimazole subacute thyroiditis with hypothyroidism: As per Dr Loanne Drilling she was supposed to follow upw ith on April 1 st but missed the appointment and continued to take methimazole. High TSH with low free T4.  She complains of neck pain and she is tender on examination of the thyroid as well.  Clinically her findings are consistent with subacute thyroiditis.  We will start her on naproxen 500 mg 3 times daily as well as omeprazole.  Holding methimazole as per discussion between hospitalist and her endocrinologist.  Patient  has follow-up appointment with endocrinologist in 2 weeks.  Hypertension; very well controlled.  Continue Norvasc and as needed hydralazine.  Intentional tremors: She did not have any tremors on my examination.  She was requesting to resume her propranolol however I advised her that she is bradycardic at this point in time and propranolol might worsen her situation.  She understands that.  DVT prophylaxis: (Lovenox) Code Status: full code.  Family Communication: none at bedside.  Disposition:   Status is: Inpatient  Remains inpatient appropriate because:IV treatments appropriate due to intensity of illness or inability to take PO   Dispo: The patient is from: Home              Anticipated d/c is to: Home              Patient currently is not medically stable to d/c.   Difficult to place patient No       Consultants:   Discussed with Dr Loanne Drilling over the phone.    Procedures: none.   Antimicrobials:  Antibiotics Given (last 72 hours)    Date/Time Action Medication Dose Rate   06/06/20 0033 New Bag/Given   clindamycin (CLEOCIN) IVPB 600 mg 600 mg 100 mL/hr   06/06/20 0536 New Bag/Given   clindamycin (CLEOCIN) IVPB 600 mg 600 mg 100 mL/hr   06/06/20 1413 New Bag/Given   clindamycin (CLEOCIN) IVPB 600 mg 600 mg 100 mL/hr   06/06/20 2206 New Bag/Given   clindamycin (CLEOCIN) IVPB 600 mg 600 mg 100 mL/hr   06/07/20 0277 New Bag/Given  clindamycin (CLEOCIN) IVPB 600 mg 600 mg 100 mL/hr         Subjective: Patient seen and examined.  Complains of neck pain, neck pressure to the point that sometimes she has to use inhaler to help with the breathing.  Also has headaches and tremors and requesting to resume her propranolol.  Objective: Vitals:   06/06/20 1305 06/06/20 1844 06/06/20 2130 06/07/20 0427  BP: (!) 147/88 (!) 178/88 125/68 132/68  Pulse:   (!) 51 62  Resp: 16  20 18   Temp: 98.3 F (36.8 C)  97.8 F (36.6 C) 97.7 F (36.5 C)  TempSrc: Oral   Oral  SpO2:  100%  97% 99%  Weight:      Height:        Intake/Output Summary (Last 24 hours) at 06/07/2020 1212 Last data filed at 06/06/2020 1700 Gross per 24 hour  Intake 995 ml  Output --  Net 995 ml   Filed Weights   06/06/20 0728  Weight: 76.2 kg    Examination:  General exam: Appears in discomfort and some pain Neck: Multinodular large tender goiter Respiratory system: Clear to auscultation. Respiratory effort normal. Cardiovascular system: S1 & S2 heard, RRR. No JVD, murmurs, rubs, gallops or clicks. No pedal edema. Gastrointestinal system: Abdomen is nondistended, soft and nontender. No organomegaly or masses felt. Normal bowel sounds heard. Central nervous system: Alert and oriented. No focal neurological deficits. Extremities: Symmetric 5 x 5 power. Skin: No rashes, lesions or ulcers.  Psychiatry: Judgement and insight appear normal. Mood & affect appropriate.    Data Reviewed: I have personally reviewed following labs and imaging studies  CBC: Recent Labs  Lab 06/05/20 1808 06/06/20 0442  WBC 5.0 5.3  NEUTROABS 2.4  --   HGB 14.1 14.4  HCT 42.2 42.5  MCV 88.1 87.6  PLT 203 599    Basic Metabolic Panel: Recent Labs  Lab 06/05/20 1808 06/06/20 0635  NA 135 136  K 4.0 3.6  CL 102 104  CO2 23 23  GLUCOSE 106* 108*  BUN 10 9  CREATININE 0.96 0.85  CALCIUM 9.2 9.1    GFR: Estimated Creatinine Clearance: 77.4 mL/min (by C-G formula based on SCr of 0.85 mg/dL).  Liver Function Tests: No results for input(s): AST, ALT, ALKPHOS, BILITOT, PROT, ALBUMIN in the last 168 hours.  CBG: No results for input(s): GLUCAP in the last 168 hours.   Recent Results (from the past 240 hour(s))  Resp Panel by RT-PCR (Flu A&B, Covid) Nasopharyngeal Swab     Status: None   Collection Time: 06/06/20  5:45 AM   Specimen: Nasopharyngeal Swab; Nasopharyngeal(NP) swabs in vial transport medium  Result Value Ref Range Status   SARS Coronavirus 2 by RT PCR NEGATIVE NEGATIVE Final     Comment: (NOTE) SARS-CoV-2 target nucleic acids are NOT DETECTED.  The SARS-CoV-2 RNA is generally detectable in upper respiratory specimens during the acute phase of infection. The lowest concentration of SARS-CoV-2 viral copies this assay can detect is 138 copies/mL. A negative result does not preclude SARS-Cov-2 infection and should not be used as the sole basis for treatment or other patient management decisions. A negative result may occur with  improper specimen collection/handling, submission of specimen other than nasopharyngeal swab, presence of viral mutation(s) within the areas targeted by this assay, and inadequate number of viral copies(<138 copies/mL). A negative result must be combined with clinical observations, patient history, and epidemiological information. The expected result is Negative.  Fact Sheet for Patients:  EntrepreneurPulse.com.au  Fact Sheet for Healthcare Providers:  IncredibleEmployment.be  This test is no t yet approved or cleared by the [place] FDA and  has been authorized for detection and/or diagnosis of SARS-CoV-2 by FDA under an Emergency Use Authorization (EUA). This EUA will remain  in effect (meaning this test can be used) for the duration of the COVID-19 declaration under Section 564(b)(1) of the Act, 21 U.S.C.section 360bbb-3(b)(1), unless the authorization is terminated  or revoked sooner.       Influenza A by PCR NEGATIVE NEGATIVE Final   Influenza B by PCR NEGATIVE NEGATIVE Final    Comment: (NOTE) The Xpert Xpress SARS-CoV-2/FLU/RSV plus assay is intended as an aid in the diagnosis of influenza from Nasopharyngeal swab specimens and should not be used as a sole basis for treatment. Nasal washings and aspirates are unacceptable for Xpert Xpress SARS-CoV-2/FLU/RSV testing.  Fact Sheet for Patients: EntrepreneurPulse.com.au  Fact Sheet for Healthcare  Providers: IncredibleEmployment.be  This test is not yet approved or cleared by the [place] FDA and has been authorized for detection and/or diagnosis of SARS-CoV-2 by FDA under an Emergency Use Authorization (EUA). This EUA will remain in effect (meaning this test can be used) for the duration of the COVID-19 declaration under Section 564(b)(1) of the Act, 21 U.S.C. section 360bbb-3(b)(1), unless the authorization is terminated or revoked.  Performed at [facility], [place] [address] [place], [place] [address]   Respiratory (~20 pathogens) panel by PCR     Status: None   Collection Time: 06/06/20  5:45 AM   Specimen: Nasopharyngeal Swab; Respiratory  Result Value Ref Range Status   Adenovirus NOT DETECTED NOT DETECTED Final   Coronavirus 229E NOT DETECTED NOT DETECTED Final    Comment: (NOTE) The Coronavirus on the Respiratory Panel, DOES NOT test for the novel  Coronavirus (2019 nCoV)    Coronavirus HKU1 NOT DETECTED NOT DETECTED Final   Coronavirus NL63 NOT DETECTED NOT DETECTED Final   Coronavirus OC43 NOT DETECTED NOT DETECTED Final   Metapneumovirus NOT DETECTED NOT DETECTED Final   Rhinovirus / Enterovirus NOT DETECTED NOT DETECTED Final   Influenza A NOT DETECTED NOT DETECTED Final   Influenza B NOT DETECTED NOT DETECTED Final   Parainfluenza Virus 1 NOT DETECTED NOT DETECTED Final   Parainfluenza Virus 2 NOT DETECTED NOT DETECTED Final   Parainfluenza Virus 3 NOT DETECTED NOT DETECTED Final   Parainfluenza Virus 4 NOT DETECTED NOT DETECTED Final   Respiratory Syncytial Virus NOT DETECTED NOT DETECTED Final   Bordetella pertussis NOT DETECTED NOT DETECTED Final   Bordetella Parapertussis NOT DETECTED NOT DETECTED Final   Chlamydophila pneumoniae NOT DETECTED NOT DETECTED Final   Mycoplasma pneumoniae NOT DETECTED NOT DETECTED Final    Comment: Performed at [facility], [place]. [address], [place], [place] [address]  Group A Strep by PCR      Status: None   Collection Time: 06/06/20  5:45 AM   Specimen: Throat; Sterile Swab  Result Value Ref Range Status   Group A Strep by PCR NOT DETECTED NOT DETECTED Final    Comment: Performed at [facility], [place] [address] [place], [place] [address]         Radiology Studies: CT Soft Tissue Neck W Contrast  Result Date: 06/05/2020 CLINICAL DATA:  Initial evaluation for acute neck mass and swelling for 5 days, difficulty swallowing. EXAM: CT NECK WITH CONTRAST TECHNIQUE: Multidetector CT imaging of the neck was performed using the standard  protocol following the bolus administration of intravenous contrast. CONTRAST:  68mL OMNIPAQUE IOHEXOL 300 MG/ML  SOLN COMPARISON:  Prior thyroid ultrasound from 07/15/2019. FINDINGS: Pharynx and larynx: Oral cavity within normal limits without discrete mass or collection. Multiple scattered dental caries present, with no acute inflammatory changes seen about the teeth. Palatine tonsils symmetric and within normal limits. Nasopharynx normal. Mild diffuse mucosal edema seen within the oropharynx, extending inferiorly to involve the supraglottic larynx and pharynx. Findings suggestive of acute pharyngitis. Associated small layering retropharyngeal effusion, likely reactive. Epiglottis itself within normal limits without evidence for associated supraglottitis. Secondary mild narrowing of the supraglottic airway which remains patent at this time. Glottis is closed and not well assessed. Subglottic airway patent and clear. No discrete abscess or drainable fluid collection. Salivary glands: Salivary glands including the parotid and submandibular glands are within normal limits. Thyroid: Thyroid gland is markedly enlarged with heterogeneous multinodular appearance, suggesting multinodular goiter. This has been evaluated on previous imaging. (ref: J Am Coll Radiol. 2015 Feb;12(2): 143-50). Lymph nodes: No enlarged or pathologic adenopathy seen within the neck.  Vascular: Normal intravascular enhancement seen throughout the neck. Both carotid arteries are splayed around the enlarged thyroid. Limited intracranial: Unremarkable. Visualized orbits: Unremarkable. Mastoids and visualized paranasal sinuses: Visualized paranasal sinuses are clear. Visualized mastoids and middle ear cavities are well pneumatized and free of fluid. Skeleton: No acute osseous finding. No discrete or worrisome osseous lesions. Upper chest: Visualized upper chest demonstrates no acute finding. Emphysematous changes noted within the visualized lungs. Other: None. IMPRESSION: 1. Mild diffuse mucosal edema within the pharynx, suggesting acute pharyngitis. Associated small layering retropharyngeal effusion, likely reactive. No discrete abscess or drainable fluid collection. Secondary mild narrowing of the supraglottic airway which remains patent at this time. 2. Markedly enlarged and heterogeneous multinodular thyroid gland, suggesting multinodular goiter. This has been evaluated on previous imaging. 3. Poor dentition without acute inflammatory changes. 4. Emphysema (ICD10-J43.9). Electronically Signed   By: Jeannine Boga M.D.   On: 06/05/2020 20:51        Scheduled Meds: . amLODipine  5 mg Oral Daily  .  morphine injection  1 mg Intravenous Once  . naproxen  500 mg Oral TID WC  . pantoprazole  40 mg Oral Daily   Continuous Infusions: . sodium chloride 100 mL/hr at 06/07/20 0854  . clindamycin (CLEOCIN) IV 600 mg (06/07/20 0629)     LOS: 1 day   Time spent in minutes: 35 minutes Darliss Cheney, MD [facility]   To contact the attending provider between 7A-7P or the covering provider during after hours 7P-7A, please log into the web site [email or web address] and access using universal [place] password for that web site. If you do not have the password, please call the hospital operator.  06/07/2020, 12:12 PM

## 2020-06-08 DIAGNOSIS — E061 Subacute thyroiditis: Principal | ICD-10-CM

## 2020-06-08 LAB — CBC WITH DIFFERENTIAL/PLATELET
Abs Immature Granulocytes: 0.02 10*3/uL (ref 0.00–0.07)
Basophils Absolute: 0 10*3/uL (ref 0.0–0.1)
Basophils Relative: 0 %
Eosinophils Absolute: 0 10*3/uL (ref 0.0–0.5)
Eosinophils Relative: 1 %
HCT: 39.1 % (ref 36.0–46.0)
Hemoglobin: 13.3 g/dL (ref 12.0–15.0)
Immature Granulocytes: 0 %
Lymphocytes Relative: 48 %
Lymphs Abs: 2.5 10*3/uL (ref 0.7–4.0)
MCH: 29.5 pg (ref 26.0–34.0)
MCHC: 34 g/dL (ref 30.0–36.0)
MCV: 86.7 fL (ref 80.0–100.0)
Monocytes Absolute: 0.4 10*3/uL (ref 0.1–1.0)
Monocytes Relative: 7 %
Neutro Abs: 2.3 10*3/uL (ref 1.7–7.7)
Neutrophils Relative %: 44 %
Platelets: 171 10*3/uL (ref 150–400)
RBC: 4.51 MIL/uL (ref 3.87–5.11)
RDW: 15.6 % — ABNORMAL HIGH (ref 11.5–15.5)
WBC: 5.3 10*3/uL (ref 4.0–10.5)
nRBC: 0 % (ref 0.0–0.2)

## 2020-06-08 LAB — COMPREHENSIVE METABOLIC PANEL
ALT: 23 U/L (ref 0–44)
AST: 38 U/L (ref 15–41)
Albumin: 3.9 g/dL (ref 3.5–5.0)
Alkaline Phosphatase: 138 U/L — ABNORMAL HIGH (ref 38–126)
Anion gap: 9 (ref 5–15)
BUN: 5 mg/dL — ABNORMAL LOW (ref 6–20)
CO2: 26 mmol/L (ref 22–32)
Calcium: 8.7 mg/dL — ABNORMAL LOW (ref 8.9–10.3)
Chloride: 102 mmol/L (ref 98–111)
Creatinine, Ser: 1.08 mg/dL — ABNORMAL HIGH (ref 0.44–1.00)
GFR, Estimated: >60 mL/min (ref >60)
Glucose, Bld: 92 mg/dL (ref 70–99)
Potassium: 3.4 mmol/L — ABNORMAL LOW (ref 3.5–5.1)
Sodium: 137 mmol/L (ref 135–145)
Total Bilirubin: 1.9 mg/dL — ABNORMAL HIGH (ref 0.3–1.2)
Total Protein: 7.2 g/dL (ref 6.5–8.1)

## 2020-06-08 MED ORDER — NAPROXEN 500 MG PO TABS: 500.0000 mg | ORAL_TABLET | Freq: Three times a day (TID) | ORAL | 0 refills | Status: AC

## 2020-06-08 MED ORDER — SODIUM CHLORIDE 0.9 % IV SOLN
12.5000 mg | Freq: Four times a day (QID) | INTRAVENOUS | Status: DC | PRN
  Administered 2020-06-08: 12.5 mg via INTRAVENOUS
  Filled 2020-06-08 (×2): qty 0.5

## 2020-06-08 MED ORDER — TRAMADOL HCL 50 MG PO TABS
100.0000 mg | ORAL_TABLET | Freq: Two times a day (BID) | ORAL | Status: DC | PRN
  Administered 2020-06-09: 100 mg via ORAL
  Filled 2020-06-08: qty 2

## 2020-06-08 MED ORDER — PANTOPRAZOLE SODIUM 40 MG PO TBEC: 40.0000 mg | DELAYED_RELEASE_TABLET | Freq: Every day | ORAL | 0 refills | Status: DC

## 2020-06-08 MED ORDER — AMLODIPINE BESYLATE 5 MG PO TABS: 5.0000 mg | ORAL_TABLET | Freq: Every day | ORAL | 0 refills | Status: AC

## 2020-06-08 MED ORDER — SODIUM CHLORIDE 0.9 % IV SOLN: INTRAVENOUS | Status: AC

## 2020-06-08 MED ORDER — POTASSIUM CHLORIDE CRYS ER 20 MEQ PO TBCR
40.0000 meq | EXTENDED_RELEASE_TABLET | Freq: Once | ORAL | Status: AC
  Administered 2020-06-08: 40 meq via ORAL
  Filled 2020-06-08: qty 2

## 2020-06-08 NOTE — Progress Notes (Signed)
PROGRESS NOTE    Shelley Cunningham  WIO:[id number] DOB: Dec 10, 1965 DOA: 06/05/2020 PCP: Elenore Paddy, NP    Chief Complaint  Patient presents with  . Neck Pain    Brief Narrative:  Pt admitted earlier this am by Dr Tonie Griffith, please see note for detailed H&P.    Shelley Cunningham is a 55 y.o. female with medical history significant for HTN, hyperthyroidism and mild asthma who presents for evaluation of increasing mass in neck with change in her voice and pain with swallowing. She is on methimazole and is s/p RAI for hyperthyroidism.    CT scan showed diffuse mucosal edema of the pharynx consistent with acute pharyngitis.  She also has a small retropharyngeal effusion but no discrete or drainable fluid collection.  He states there is mild narrowing of the supraglottal airway but it is patent.  She has an enlarged heterogeneous multinodular thyroid gland on CT.  ER physician discussed with on-call ENT who recommended patient be admitted to the hospital service for further monitoring and work-up.    Assessment & Plan:   Principal Problem:   Subacute thyroiditis Active Problems:   Hyperthyroidism   Essential hypertension   Pharyngitis   Acute Pharyngitis in the setting of hypothyroidism and methimazole use: Strep throat negative.  Still complains of throat pain, continue clindamycin.  History of multinodular goiter status post RAI treatment in November with concomitant use of methimazole subacute thyroiditis with hypothyroidism: As per Dr Loanne Drilling she was supposed to follow upw ith on April 1 st but missed the appointment and continued to take methimazole. High TSH with low free T4.  Her neck pain is improved.  She still feels that her goiter has not reduced in size but clearly on examination, there is significant reduction in her goiter.  Tenderness has also improved.  She denied any nausea or vomiting either.  We discussed about plan of discharge and she was agreeable with that 2.  She  requested to advance her diet to soft diet.  However after discharge paperwork was completed, she complained of vomiting and increasing headaches and neck pain and did not feel comfortable going home.  Discharge canceled.  Continue naproxen and omeprazole.  Added as needed Phenergan.  Holding methimazole as per discussion between hospitalist and her endocrinologist.  Patient has follow-up appointment with endocrinologist in 2 weeks.  Hypertension; slightly elevated this morning.  Continue Norvasc and as needed hydralazine.  Intentional tremors: Continue to hold propranolol due to intermittent bradycardia  DVT prophylaxis: (Lovenox) Code Status: full code.  Family Communication: none at bedside.  Disposition:   Status is: Inpatient  Remains inpatient appropriate because:IV treatments appropriate due to intensity of illness or inability to take PO   Dispo: The patient is from: Home              Anticipated d/c is to: Home              Patient currently is not medically stable to d/c.   Difficult to place patient No       Consultants:   Discussed with Dr Loanne Drilling over the phone by previous hospitalist.   Procedures: none.   Antimicrobials:  Antibiotics Given (last 72 hours)    Date/Time Action Medication Dose Rate   06/06/20 0033 New Bag/Given   clindamycin (CLEOCIN) IVPB 600 mg 600 mg 100 mL/hr   06/06/20 0536 New Bag/Given   clindamycin (CLEOCIN) IVPB 600 mg 600 mg 100 mL/hr   06/06/20 1413 New Bag/Given   clindamycin (  CLEOCIN) IVPB 600 mg 600 mg 100 mL/hr   06/06/20 2206 New Bag/Given   clindamycin (CLEOCIN) IVPB 600 mg 600 mg 100 mL/hr   06/07/20 0629 New Bag/Given   clindamycin (CLEOCIN) IVPB 600 mg 600 mg 100 mL/hr   06/07/20 1421 New Bag/Given   clindamycin (CLEOCIN) IVPB 600 mg 600 mg 100 mL/hr   06/07/20 2104 New Bag/Given   clindamycin (CLEOCIN) IVPB 600 mg 600 mg 100 mL/hr   06/08/20 0541 New Bag/Given   clindamycin (CLEOCIN) IVPB 600 mg 600 mg 100 mL/hr          Subjective: Patient seen and examined.  She was still complaining of large goiter but other symptoms such as neck pain, headaches had improved.  Denied any shortness of breath or any nausea.  Objective: Vitals:   06/08/20 0801 06/08/20 0923 06/08/20 0926 06/08/20 0928  BP: 130/81  (!) 168/95 (!) 162/91  Pulse:   (!) 56   Resp:  18 18   Temp:   97.8 F (36.6 C)   TempSrc:  Oral Oral   SpO2:   100% (!) 2%  Weight:      Height:       No intake or output data in the 24 hours ending 06/08/20 1125 Filed Weights   06/06/20 0728  Weight: 76.2 kg    Examination:  General exam: Appears calm and comfortable  Neck: Multinodular goiter has reduced significantly in size.  Very minimal tenderness. Respiratory system: Clear to auscultation. Respiratory effort normal. Cardiovascular system: S1 & S2 heard, RRR. No JVD, murmurs, rubs, gallops or clicks. No pedal edema. Gastrointestinal system: Abdomen is nondistended, soft and nontender. No organomegaly or masses felt. Normal bowel sounds heard. Central nervous system: Alert and oriented. No focal neurological deficits. Extremities: Symmetric 5 x 5 power. Skin: No rashes, lesions or ulcers.  Psychiatry: Judgement and insight appear normal. Mood & affect appropriate.    Data Reviewed: I have personally reviewed following labs and imaging studies  CBC: Recent Labs  Lab 06/05/20 1808 06/06/20 0442 06/08/20 0226  WBC 5.0 5.3 5.3  NEUTROABS 2.4  --  2.3  HGB 14.1 14.4 13.3  HCT 42.2 42.5 39.1  MCV 88.1 87.6 86.7  PLT 203 164 673    Basic Metabolic Panel: Recent Labs  Lab 06/05/20 1808 06/06/20 0635 06/08/20 0226  NA 135 136 137  K 4.0 3.6 3.4*  CL 102 104 102  CO2 23 23 26   GLUCOSE 106* 108* 92  BUN 10 9 5*  CREATININE 0.96 0.85 1.08*  CALCIUM 9.2 9.1 8.7*    GFR: Estimated Creatinine Clearance: 60.9 mL/min (A) (by C-G formula based on SCr of 1.08 mg/dL (H)).  Liver Function Tests: Recent Labs  Lab  06/08/20 0226  AST 38  ALT 23  ALKPHOS 138*  BILITOT 1.9*  PROT 7.2  ALBUMIN 3.9    CBG: No results for input(s): GLUCAP in the last 168 hours.   Recent Results (from the past 240 hour(s))  Resp Panel by RT-PCR (Flu A&B, Covid) Nasopharyngeal Swab     Status: None   Collection Time: 06/06/20  5:45 AM   Specimen: Nasopharyngeal Swab; Nasopharyngeal(NP) swabs in vial transport medium  Result Value Ref Range Status   SARS Coronavirus 2 by RT PCR NEGATIVE NEGATIVE Final    Comment: (NOTE) SARS-CoV-2 target nucleic acids are NOT DETECTED.  The SARS-CoV-2 RNA is generally detectable in upper respiratory specimens during the acute phase of infection. The lowest concentration of SARS-CoV-2 viral copies this  assay can detect is 138 copies/mL. A negative result does not preclude SARS-Cov-2 infection and should not be used as the sole basis for treatment or other patient management decisions. A negative result may occur with  improper specimen collection/handling, submission of specimen other than nasopharyngeal swab, presence of viral mutation(s) within the areas targeted by this assay, and inadequate number of viral copies(<138 copies/mL). A negative result must be combined with clinical observations, patient history, and epidemiological information. The expected result is Negative.  Fact Sheet for Patients:  EntrepreneurPulse.com.au  Fact Sheet for Healthcare Providers:  IncredibleEmployment.be  This test is no t yet approved or cleared by the [place] FDA and  has been authorized for detection and/or diagnosis of SARS-CoV-2 by FDA under an Emergency Use Authorization (EUA). This EUA will remain  in effect (meaning this test can be used) for the duration of the COVID-19 declaration under Section 564(b)(1) of the Act, 21 U.S.C.section 360bbb-3(b)(1), unless the authorization is terminated  or revoked sooner.       Influenza A by PCR  NEGATIVE NEGATIVE Final   Influenza B by PCR NEGATIVE NEGATIVE Final    Comment: (NOTE) The Xpert Xpress SARS-CoV-2/FLU/RSV plus assay is intended as an aid in the diagnosis of influenza from Nasopharyngeal swab specimens and should not be used as a sole basis for treatment. Nasal washings and aspirates are unacceptable for Xpert Xpress SARS-CoV-2/FLU/RSV testing.  Fact Sheet for Patients: EntrepreneurPulse.com.au  Fact Sheet for Healthcare Providers: IncredibleEmployment.be  This test is not yet approved or cleared by the [place] FDA and has been authorized for detection and/or diagnosis of SARS-CoV-2 by FDA under an Emergency Use Authorization (EUA). This EUA will remain in effect (meaning this test can be used) for the duration of the COVID-19 declaration under Section 564(b)(1) of the Act, 21 U.S.C. section 360bbb-3(b)(1), unless the authorization is terminated or revoked.  Performed at [facility], [place] [address], [place], [place] [address]   Respiratory (~20 pathogens) panel by PCR     Status: None   Collection Time: 06/06/20  5:45 AM   Specimen: Nasopharyngeal Swab; Respiratory  Result Value Ref Range Status   Adenovirus NOT DETECTED NOT DETECTED Final   Coronavirus 229E NOT DETECTED NOT DETECTED Final    Comment: (NOTE) The Coronavirus on the Respiratory Panel, DOES NOT test for the novel  Coronavirus (2019 nCoV)    Coronavirus HKU1 NOT DETECTED NOT DETECTED Final   Coronavirus NL63 NOT DETECTED NOT DETECTED Final   Coronavirus OC43 NOT DETECTED NOT DETECTED Final   Metapneumovirus NOT DETECTED NOT DETECTED Final   Rhinovirus / Enterovirus NOT DETECTED NOT DETECTED Final   Influenza A NOT DETECTED NOT DETECTED Final   Influenza B NOT DETECTED NOT DETECTED Final   Parainfluenza Virus 1 NOT DETECTED NOT DETECTED Final   Parainfluenza Virus 2 NOT DETECTED NOT DETECTED Final   Parainfluenza Virus 3 NOT DETECTED NOT  DETECTED Final   Parainfluenza Virus 4 NOT DETECTED NOT DETECTED Final   Respiratory Syncytial Virus NOT DETECTED NOT DETECTED Final   Bordetella pertussis NOT DETECTED NOT DETECTED Final   Bordetella Parapertussis NOT DETECTED NOT DETECTED Final   Chlamydophila pneumoniae NOT DETECTED NOT DETECTED Final   Mycoplasma pneumoniae NOT DETECTED NOT DETECTED Final    Comment: Performed at [facility], [place]. [address], [place], [place] [address]  Group A Strep by PCR     Status: None   Collection Time: 06/06/20  5:45 AM   Specimen: Throat;  Sterile Swab  Result Value Ref Range Status   Group A Strep by PCR NOT DETECTED NOT DETECTED Final    Comment: Performed at [facility], [place] [address] [place],  [place] [address]         Radiology Studies: No results found.      Scheduled Meds: . amLODipine  5 mg Oral Daily  . naproxen  500 mg Oral TID WC  . pantoprazole  40 mg Oral Daily   Continuous Infusions: . sodium chloride 100 mL/hr at 06/07/20 1926  . sodium chloride 250 mL/hr at 06/08/20 0803  . clindamycin (CLEOCIN) IV 600 mg (06/08/20 0541)  . promethazine (PHENERGAN) injection (IM or IVPB) 12.5 mg (06/08/20 1113)     LOS: 2 days   Time spent in minutes: 30 minutes Darliss Cheney, MD [facility]   To contact the attending provider between 7A-7P or the covering provider during after hours 7P-7A, please log into the web site [email or web address] and access using universal [place] password for that web site. If you do not have the password, please call the hospital operator.  06/08/2020, 11:25 AM

## 2020-06-08 NOTE — Discharge Instructions (Signed)
Goiter  A goiter is an enlarged thyroid gland. The thyroid is located in the lower front of the neck. It makes hormones that affect many body parts and systems, including the system that affects how quickly the body burns fuel for energy (metabolism). Most goiters are painless and are not a cause for concern. Some goiters can affect the way your thyroid makes thyroid hormones. Goiters and conditions that cause goiters can be treated, if necessary. What are the causes? Common causes of this condition include:  Lack (deficiency) of a mineral called iodine. The thyroid gland uses iodine to make thyroid hormones.  Diseases that attack healthy cells in the body (autoimmune diseases) and affect thyroid function, such as Graves' disease or Hashimoto's disease. These diseases may cause the body to produce too much thyroid hormone (hyperthyroidism) or too little of the hormone (hypothyroidism).  Conditions that cause inflammation of the thyroid (thyroiditis).  One or more small growths on the thyroid (nodular goiter). Other causes include:  Medical problems caused by abnormal genes that are passed from parent to child (genetic defects).  Thyroid injury or infection.  Tumors that may or may not be cancerous.  Pregnancy.  Certain medicines.  Exposure to radiation. In some cases, the cause may not be known. What increases the risk? This condition is more likely to develop in:  People who do not get enough iodine in their diet.  People who have a family history of goiter.  Women.  People who are older than age 40.  People who smoke tobacco.  People who have had exposure to radiation. What are the signs or symptoms? The main symptom of this condition is swelling in the lower, front part of the neck. This swelling can range from a very small bump to a large lump. Other symptoms may include:  A tight feeling in the throat.  A hoarse voice.  Coughing.  Wheezing.  Difficulty  swallowing or breathing.  Bulging veins in the neck.  Dizziness. When a goiter is the result of an overactive thyroid (hyperthyroidism), symptoms may also include:  Nervousness or restlessness.  Inability to tolerate heat.  Unexplained weight loss.  Diarrhea.  Change in the texture of hair or skin.  Changes in heartbeat, such as skipped beats, extra beats, or a rapid heart rate.  Loss of menstruation.  Shaky hands.  Increased appetite.  Sleep problems. When a goiter is the result of an underactive thyroid (hypothyroidism), symptoms may also include:  Feeling like you have no energy (lethargy).  Inability to tolerate cold.  Weight gain that is not explained by a change in diet or exercise habits.  Dry skin.  Coarse hair.  Irregular menstrual periods.  Constipation.  Sadness or depression.  Fatigue. In some cases, there may not be any symptoms and the thyroid hormone levels may be normal.   How is this diagnosed? This condition may be diagnosed based on your symptoms, your medical history, and a physical exam. You may have tests, such as:  Blood tests to check thyroid function.  Imaging tests, such as: ? Ultrasound. ? CT scan. ? MRI. ? Thyroid scan.  Removal of a tissue sample (biopsy) of the goiter or any nodules. The sample will be tested to check for cancer. How is this treated? Treatment for this condition depends on the cause and your symptoms. Treatment may include:  Medicines to regulate thyroid hormone levels.  Anti-inflammatory medicines or steroid medicines, if the goiter is caused by inflammation.  Iodine supplements or changes   to your diet, if the goiter is caused by iodine deficiency.  Radioactive iodine treatment.  Surgery to remove your thyroid. In some cases, you may only need regular check-ups with your health care provider to monitor your condition, and you may not need treatment. Follow these instructions at home:  Follow  instructions from your health care provider about any changes to your diet.  Take over-the-counter and prescription medicines only as told by your health care provider. These include supplements.  Do not use any products that contain nicotine or tobacco, such as cigarettes and e-cigarettes. If you need help quitting, ask your health care provider.  Keep all follow-up visits as told by your health care provider. This is important. Contact a health care provider if:  Your symptoms do not get better with treatment.  You have nausea, vomiting, or diarrhea. Get help right away if:  You have sudden, unexplained confusion or other mental changes.  You have a fever.  You have chest pain.  You have trouble breathing or swallowing.  You suddenly become very weak.  You experience extreme restlessness.  You feel your heart racing. Summary  A goiter is an enlarged thyroid gland.  The thyroid gland is located in the lower front of the neck. It makes hormones that affect many body parts and systems, including the system that affects how quickly the body burns fuel for energy (metabolism).  The main symptom of this condition is swelling in the lower, front part of the neck. This swelling can range from a very small bump to a large lump.  Treatment for this condition depends on the cause and your symptoms. You may need medicines, supplements, or regular monitoring of your condition. This information is not intended to replace advice given to you by your health care provider. Make sure you discuss any questions you have with your health care provider. Document Revised: 10/07/2019 Document Reviewed: 10/07/2019 Elsevier Patient Education  2021 [employer].  

## 2020-06-09 LAB — BASIC METABOLIC PANEL
Anion gap: 8 (ref 5–15)
BUN: 6 mg/dL (ref 6–20)
CO2: 26 mmol/L (ref 22–32)
Calcium: 9.1 mg/dL (ref 8.9–10.3)
Chloride: 102 mmol/L (ref 98–111)
Creatinine, Ser: 1.12 mg/dL — ABNORMAL HIGH (ref 0.44–1.00)
GFR, Estimated: 59 mL/min — ABNORMAL LOW (ref >60)
Glucose, Bld: 100 mg/dL — ABNORMAL HIGH (ref 70–99)
Potassium: 3.5 mmol/L (ref 3.5–5.1)
Sodium: 136 mmol/L (ref 135–145)

## 2020-06-09 MED ORDER — ONDANSETRON 4 MG PO TBDP: 4.0000 mg | ORAL_TABLET | Freq: Three times a day (TID) | ORAL | 0 refills | Status: DC | PRN

## 2020-06-09 NOTE — Progress Notes (Signed)
PIV removed and patient gathered up all belongings. Verbalized understanding of DC instructions as well as follow up appointments. No distress noted. Patient taken to ED entrance via wheelchair with tech.  Patient aware that she was given Pain medicine this morning at 1000. States she is OK to drive. No confusion or disorientation noted.

## 2020-06-09 NOTE — Discharge Summary (Signed)
Physician Discharge Summary  Shelley Cunningham [id number] DOB: Jan 29, 1966 DOA: 06/05/2020  PCP: Elenore Paddy, NP  Admit date: 06/05/2020 Discharge date: 06/09/2020 30 Day Unplanned Readmission Risk Score   Flowsheet Row ED to Hosp-Admission (Current) from 06/05/2020 in [place] [facility]  30 Day Unplanned Readmission Risk Score (%) 6.98 Filed at 06/09/2020 0801     This score is the patient's risk of an unplanned readmission within 30 days of being discharged (0 -100%). The score is based on dignosis, age, lab data, medications, orders, and past utilization.   Low:  0-14.9   Medium: 15-21.9   High: 22-29.9   Extreme: 30 and above         Admitted From: Home Disposition: Home  Recommendations for Outpatient Follow-up:  1. Follow up with PCP in 1-2 weeks 2. Follow-up with your endocrinologist in 2 weeks 3. Please obtain BMP/CBC in one week 4. Please follow up with your PCP on the following pending results: Unresulted Labs (From admission, onward)          Start     Ordered   06/09/20 0000000  Basic metabolic panel  Once,   R       Question:  Specimen collection method  Answer:  Lab=Lab collect   06/09/20 0754            Home Health: None Equipment/Devices: None  Discharge Condition: Stable CODE STATUS: Full code Diet recommendation: Cardiac  Subjective: Seen and examined.  Feels much better.  No nausea.  Wants to go home.  Brief/Interim Summary: Shelley Cunningham a 55 y.o.femalewith medical history significant forHTN, hyperthyroidismand mild asthma whopresented for evaluation of increasing mass in neck with change in her voice and pain with swallowing. She was on methimazole and is s/p RAI for hyperthyroidism.  CT scan soft tissue neck showed diffuse mucosal edema of the pharynx consistent with acute pharyngitis. She also has a small retropharyngeal effusion but no discrete or drainable fluid collection. there is mild narrowing of the  supraglottal airway but it is patent. She has an enlarged heterogeneous multinodular thyroid gland on CT. ER physician discussed with on-call ENT who recommended patient be admitted to the hospital service for further monitoring and work-up.  She was admitted with multinodular goiter and acute pharyngitis.  She was started on antibiotics.  Her TSH was significantly elevated and free T4 low indicating hypothyroidism.  Previous hospitalist discussed case with patient's regular endocrinologist Dr. Loanne Drilling who recommended stopping her methimazole.  Patient also had bradycardia so her propranolol was stopped.  Her blood pressure was elevated so amlodipine was added.  When I assumed care of this patient and on my examination on 06/07/2020, patient had significant tenderness of goiter indicating subacute thyroiditis.  She is still complaining of feeling of trouble swallowing and breathing.  She was started on naproxen and PPI.  Over the course of last 2 days, patient has improved.  Her goiter size has also reduced and she is no more tender on palpation.  She is able to tolerate soft diet.  She has no complaints and wants to go home.  She is being discharged in stable condition for few more days of oral PPI along with naproxen for the treatment of subacute thyroiditis and will follow with PCP as well as endocrinologist.  Discharge Diagnoses:  Principal Problem:   Subacute thyroiditis Active Problems:   Hyperthyroidism   Essential hypertension   Pharyngitis    Discharge Instructions   Allergies as of 06/09/2020  Reactions   Flagyl [metronidazole] Nausea And Vomiting      Medication List    STOP taking these medications   methimazole 10 MG tablet Commonly known as: TAPAZOLE   propranolol 10 MG tablet Commonly known as: INDERAL     TAKE these medications   albuterol 108 (90 Base) MCG/ACT inhaler Commonly known as: VENTOLIN HFA Inhale 2 puffs into the lungs every 4 (four) hours as needed for  wheezing or shortness of breath.   amLODipine 5 MG tablet Commonly known as: NORVASC Take 1 tablet (5 mg total) by mouth daily.   naproxen 500 MG tablet Commonly known as: NAPROSYN Take 1 tablet (500 mg total) by mouth 3 (three) times daily with meals for 5 days.   ondansetron 4 MG disintegrating tablet Commonly known as: ZOFRAN-ODT Take 1 tablet (4 mg total) by mouth every 8 (eight) hours as needed for nausea or vomiting.   pantoprazole 40 MG tablet Commonly known as: PROTONIX Take 1 tablet (40 mg total) by mouth daily for 7 days.       Follow-up Information    Elenore Paddy, NP Follow up in 1 week(s).   Specialty: Nurse Practitioner Contact information: [place] [address] [phone]        Renato Shin, MD Follow up in 2 week(s).   Specialty: Endocrinology Contact information: [address] [place] [address] [phone]              Allergies  Allergen Reactions  . Flagyl [Metronidazole] Nausea And Vomiting    Consultations: endocrinologist over the phone and ENT over the phone   Procedures/Studies: CT Soft Tissue Neck W Contrast  Result Date: 06/05/2020 CLINICAL DATA:  Initial evaluation for acute neck mass and swelling for 5 days, difficulty swallowing. EXAM: CT NECK WITH CONTRAST TECHNIQUE: Multidetector CT imaging of the neck was performed using the standard protocol following the bolus administration of intravenous contrast. CONTRAST:  8mL OMNIPAQUE IOHEXOL 300 MG/ML  SOLN COMPARISON:  Prior thyroid ultrasound from 07/15/2019. FINDINGS: Pharynx and larynx: Oral cavity within normal limits without discrete mass or collection. Multiple scattered dental caries present, with no acute inflammatory changes seen about the teeth. Palatine tonsils symmetric and within normal limits. Nasopharynx normal. Mild diffuse mucosal edema seen within the oropharynx, extending inferiorly to involve the supraglottic larynx and  pharynx. Findings suggestive of acute pharyngitis. Associated small layering retropharyngeal effusion, likely reactive. Epiglottis itself within normal limits without evidence for associated supraglottitis. Secondary mild narrowing of the supraglottic airway which remains patent at this time. Glottis is closed and not well assessed. Subglottic airway patent and clear. No discrete abscess or drainable fluid collection. Salivary glands: Salivary glands including the parotid and submandibular glands are within normal limits. Thyroid: Thyroid gland is markedly enlarged with heterogeneous multinodular appearance, suggesting multinodular goiter. This has been evaluated on previous imaging. (ref: J Am Coll Radiol. 2015 Feb;12(2): 143-50). Lymph nodes: No enlarged or pathologic adenopathy seen within the neck. Vascular: Normal intravascular enhancement seen throughout the neck. Both carotid arteries are splayed around the enlarged thyroid. Limited intracranial: Unremarkable. Visualized orbits: Unremarkable. Mastoids and visualized paranasal sinuses: Visualized paranasal sinuses are clear. Visualized mastoids and middle ear cavities are well pneumatized and free of fluid. Skeleton: No acute osseous finding. No discrete or worrisome osseous lesions. Upper chest: Visualized upper chest demonstrates no acute finding. Emphysematous changes noted within the visualized lungs. Other: None. IMPRESSION: 1. Mild diffuse mucosal edema within the pharynx, suggesting acute pharyngitis.  Associated small layering retropharyngeal effusion, likely reactive. No discrete abscess or drainable fluid collection. Secondary mild narrowing of the supraglottic airway which remains patent at this time. 2. Markedly enlarged and heterogeneous multinodular thyroid gland, suggesting multinodular goiter. This has been evaluated on previous imaging. 3. Poor dentition without acute inflammatory changes. 4. Emphysema (ICD10-J43.9). Electronically Signed   By:  Jeannine Boga M.D.   On: 06/05/2020 20:51      Discharge Exam: Vitals:   06/08/20 0928 06/08/20 2204  BP: (!) 162/91 129/70  Pulse:  (!) 54  Resp:  16  Temp:  98.6 F (37 C)  SpO2: 98% 97%   Vitals:   06/08/20 0923 06/08/20 0926 06/08/20 0928 06/08/20 2204  BP:  (!) 168/95 (!) 162/91 129/70  Pulse:  (!) 56  (!) 54  Resp: 18 18  16   Temp:  97.8 F (36.6 C)  98.6 F (37 C)  TempSrc: Oral Oral  Oral  SpO2:  100% 98% 97%  Weight:      Height:        General: Pt is alert, awake, not in acute distress Cardiovascular: RRR, S1/S2 +, no rubs, no gallops Neck: Palpable goiter but nontender. Respiratory: CTA bilaterally, no wheezing, no rhonchi Abdominal: Soft, NT, ND, bowel sounds + Extremities: no edema, no cyanosis    The results of significant diagnostics from this hospitalization (including imaging, microbiology, ancillary and laboratory) are listed below for reference.     Microbiology: Recent Results (from the past 240 hour(s))  Resp Panel by RT-PCR (Flu A&B, Covid) Nasopharyngeal Swab     Status: None   Collection Time: 06/06/20  5:45 AM   Specimen: Nasopharyngeal Swab; Nasopharyngeal(NP) swabs in vial transport medium  Result Value Ref Range Status   SARS Coronavirus 2 by RT PCR NEGATIVE NEGATIVE Final    Comment: (NOTE) SARS-CoV-2 target nucleic acids are NOT DETECTED.  The SARS-CoV-2 RNA is generally detectable in upper respiratory specimens during the acute phase of infection. The lowest concentration of SARS-CoV-2 viral copies this assay can detect is 138 copies/mL. A negative result does not preclude SARS-Cov-2 infection and should not be used as the sole basis for treatment or other patient management decisions. A negative result may occur with  improper specimen collection/handling, submission of specimen other than nasopharyngeal swab, presence of viral mutation(s) within the areas targeted by this assay, and inadequate number of  viral copies(<138 copies/mL). A negative result must be combined with clinical observations, patient history, and epidemiological information. The expected result is Negative.  Fact Sheet for Patients:  EntrepreneurPulse.com.au  Fact Sheet for Healthcare Providers:  IncredibleEmployment.be  This test is no t yet approved or cleared by the [place] FDA and  has been authorized for detection and/or diagnosis of SARS-CoV-2 by FDA under an Emergency Use Authorization (EUA). This EUA will remain  in effect (meaning this test can be used) for the duration of the COVID-19 declaration under Section 564(b)(1) of the Act, 21 U.S.C.section 360bbb-3(b)(1), unless the authorization is terminated  or revoked sooner.       Influenza A by PCR NEGATIVE NEGATIVE Final   Influenza B by PCR NEGATIVE NEGATIVE Final    Comment: (NOTE) The Xpert Xpress SARS-CoV-2/FLU/RSV plus assay is intended as an aid in the diagnosis of influenza from Nasopharyngeal swab specimens and should not be used as a sole basis for treatment. Nasal washings and aspirates are unacceptable for Xpert Xpress SARS-CoV-2/FLU/RSV testing.  Fact Sheet for Patients: EntrepreneurPulse.com.au  Fact Sheet for Healthcare Providers:  IncredibleEmployment.be  This test is not yet approved or cleared by the [place] and has been authorized for detection and/or diagnosis of SARS-CoV-2 by FDA under an Emergency Use Authorization (EUA). This EUA will remain in effect (meaning this test can be used) for the duration of the COVID-19 declaration under Section 564(b)(1) of the Act, 21 U.S.C. section 360bbb-3(b)(1), unless the authorization is terminated or revoked.  Performed at [facility], [place] [address], [place], [place] [address]   Respiratory (~20 pathogens) panel by PCR     Status: None   Collection Time: 06/06/20  5:45 AM   Specimen:  Nasopharyngeal Swab; Respiratory  Result Value Ref Range Status   Adenovirus NOT DETECTED NOT DETECTED Final   Coronavirus 229E NOT DETECTED NOT DETECTED Final    Comment: (NOTE) The Coronavirus on the Respiratory Panel, DOES NOT test for the novel  Coronavirus (2019 nCoV)    Coronavirus HKU1 NOT DETECTED NOT DETECTED Final   Coronavirus NL63 NOT DETECTED NOT DETECTED Final   Coronavirus OC43 NOT DETECTED NOT DETECTED Final   Metapneumovirus NOT DETECTED NOT DETECTED Final   Rhinovirus / Enterovirus NOT DETECTED NOT DETECTED Final   Influenza A NOT DETECTED NOT DETECTED Final   Influenza B NOT DETECTED NOT DETECTED Final   Parainfluenza Virus 1 NOT DETECTED NOT DETECTED Final   Parainfluenza Virus 2 NOT DETECTED NOT DETECTED Final   Parainfluenza Virus 3 NOT DETECTED NOT DETECTED Final   Parainfluenza Virus 4 NOT DETECTED NOT DETECTED Final   Respiratory Syncytial Virus NOT DETECTED NOT DETECTED Final   Bordetella pertussis NOT DETECTED NOT DETECTED Final   Bordetella Parapertussis NOT DETECTED NOT DETECTED Final   Chlamydophila pneumoniae NOT DETECTED NOT DETECTED Final   Mycoplasma pneumoniae NOT DETECTED NOT DETECTED Final    Comment: Performed at [facility], [place]. [address], [place], [place] [address]  Group A Strep by PCR     Status: None   Collection Time: 06/06/20  5:45 AM   Specimen: Throat; Sterile Swab  Result Value Ref Range Status   Group A Strep by PCR NOT DETECTED NOT DETECTED Final    Comment: Performed at [facility], [place] [address] [place], [place] [address]     Labs: BNP (last 3 results) No results for input(s): BNP in the last 8760 hours. Basic Metabolic Panel: Recent Labs  Lab 06/05/20 1808 06/06/20 0635 06/08/20 0226  NA 135 136 137  K 4.0 3.6 3.4*  CL 102 104 102  CO2 23 23 26   GLUCOSE 106* 108* 92  BUN 10 9 5*  CREATININE 0.96 0.85 1.08*  CALCIUM 9.2 9.1 8.7*   Liver Function Tests: Recent Labs  Lab 06/08/20 0226  AST 38   ALT 23  ALKPHOS 138*  BILITOT 1.9*  PROT 7.2  ALBUMIN 3.9   No results for input(s): LIPASE, AMYLASE in the last 168 hours. No results for input(s): AMMONIA in the last 168 hours. CBC: Recent Labs  Lab 06/05/20 1808 06/06/20 0442 06/08/20 0226  WBC 5.0 5.3 5.3  NEUTROABS 2.4  --  2.3  HGB 14.1 14.4 13.3  HCT 42.2 42.5 39.1  MCV 88.1 87.6 86.7  PLT 203 164 171   Cardiac Enzymes: No results for input(s): CKTOTAL, CKMB, CKMBINDEX, TROPONINI in the last 168 hours. BNP: Invalid input(s): POCBNP CBG: No results for input(s): GLUCAP in the last 168 hours. D-Dimer No results for input(s): DDIMER in the last 72 hours. Hgb A1c No results for  input(s): HGBA1C in the last 72 hours. Lipid Profile No results for input(s): CHOL, HDL, LDLCALC, TRIG, CHOLHDL, LDLDIRECT in the last 72 hours. Thyroid function studies No results for input(s): TSH, T4TOTAL, T3FREE, THYROIDAB in the last 72 hours.  Invalid input(s): FREET3 Anemia work up No results for input(s): VITAMINB12, FOLATE, FERRITIN, TIBC, IRON, RETICCTPCT in the last 72 hours. Urinalysis    Component Value Date/Time   COLORURINE YELLOW 06/27/2007 0955   APPEARANCEUR CLOUDY (A) 06/27/2007 0955   LABSPEC 1.027 06/27/2007 0955   PHURINE 6.0 06/27/2007 0955   GLUCOSEU NEGATIVE 06/27/2007 0955   HGBUR MODERATE (A) 06/27/2007 0955   BILIRUBINUR NEGATIVE 06/27/2007 0955   KETONESUR 15 (A) 06/27/2007 0955   PROTEINUR NEGATIVE 06/27/2007 0955   UROBILINOGEN 1.0 06/27/2007 0955   NITRITE NEGATIVE 06/27/2007 0955   LEUKOCYTESUR SMALL (A) 06/27/2007 0955   Sepsis Labs Invalid input(s): PROCALCITONIN,  WBC,  LACTICIDVEN Microbiology Recent Results (from the past 240 hour(s))  Resp Panel by RT-PCR (Flu A&B, Covid) Nasopharyngeal Swab     Status: None   Collection Time: 06/06/20  5:45 AM   Specimen: Nasopharyngeal Swab; Nasopharyngeal(NP) swabs in vial transport medium  Result Value Ref Range Status   SARS Coronavirus 2 by RT PCR  NEGATIVE NEGATIVE Final    Comment: (NOTE) SARS-CoV-2 target nucleic acids are NOT DETECTED.  The SARS-CoV-2 RNA is generally detectable in upper respiratory specimens during the acute phase of infection. The lowest concentration of SARS-CoV-2 viral copies this assay can detect is 138 copies/mL. A negative result does not preclude SARS-Cov-2 infection and should not be used as the sole basis for treatment or other patient management decisions. A negative result may occur with  improper specimen collection/handling, submission of specimen other than nasopharyngeal swab, presence of viral mutation(s) within the areas targeted by this assay, and inadequate number of viral copies(<138 copies/mL). A negative result must be combined with clinical observations, patient history, and epidemiological information. The expected result is Negative.  Fact Sheet for Patients:  EntrepreneurPulse.com.au  Fact Sheet for Healthcare Providers:  IncredibleEmployment.be  This test is no t yet approved or cleared by the [place] FDA and  has been authorized for detection and/or diagnosis of SARS-CoV-2 by FDA under an Emergency Use Authorization (EUA). This EUA will remain  in effect (meaning this test can be used) for the duration of the COVID-19 declaration under Section 564(b)(1) of the Act, 21 U.S.C.section 360bbb-3(b)(1), unless the authorization is terminated  or revoked sooner.       Influenza A by PCR NEGATIVE NEGATIVE Final   Influenza B by PCR NEGATIVE NEGATIVE Final    Comment: (NOTE) The Xpert Xpress SARS-CoV-2/FLU/RSV plus assay is intended as an aid in the diagnosis of influenza from Nasopharyngeal swab specimens and should not be used as a sole basis for treatment. Nasal washings and aspirates are unacceptable for Xpert Xpress SARS-CoV-2/FLU/RSV testing.  Fact Sheet for Patients: EntrepreneurPulse.com.au  Fact Sheet for  Healthcare Providers: IncredibleEmployment.be  This test is not yet approved or cleared by the [place] FDA and has been authorized for detection and/or diagnosis of SARS-CoV-2 by FDA under an Emergency Use Authorization (EUA). This EUA will remain in effect (meaning this test can be used) for the duration of the COVID-19 declaration under Section 564(b)(1) of the Act, 21 U.S.C. section 360bbb-3(b)(1), unless the authorization is terminated or revoked.  Performed at [facility], [place] [address] [place], [place] [address]   Respiratory (~20 pathogens) panel by PCR  Status: None   Collection Time: 06/06/20  5:45 AM   Specimen: Nasopharyngeal Swab; Respiratory  Result Value Ref Range Status   Adenovirus NOT DETECTED NOT DETECTED Final   Coronavirus 229E NOT DETECTED NOT DETECTED Final    Comment: (NOTE) The Coronavirus on the Respiratory Panel, DOES NOT test for the novel  Coronavirus (2019 nCoV)    Coronavirus HKU1 NOT DETECTED NOT DETECTED Final   Coronavirus NL63 NOT DETECTED NOT DETECTED Final   Coronavirus OC43 NOT DETECTED NOT DETECTED Final   Metapneumovirus NOT DETECTED NOT DETECTED Final   Rhinovirus / Enterovirus NOT DETECTED NOT DETECTED Final   Influenza A NOT DETECTED NOT DETECTED Final   Influenza B NOT DETECTED NOT DETECTED Final   Parainfluenza Virus 1 NOT DETECTED NOT DETECTED Final   Parainfluenza Virus 2 NOT DETECTED NOT DETECTED Final   Parainfluenza Virus 3 NOT DETECTED NOT DETECTED Final   Parainfluenza Virus 4 NOT DETECTED NOT DETECTED Final   Respiratory Syncytial Virus NOT DETECTED NOT DETECTED Final   Bordetella pertussis NOT DETECTED NOT DETECTED Final   Bordetella Parapertussis NOT DETECTED NOT DETECTED Final   Chlamydophila pneumoniae NOT DETECTED NOT DETECTED Final   Mycoplasma pneumoniae NOT DETECTED NOT DETECTED Final    Comment: Performed at [facility], [place]. [address] [place], [place] [address]  Group A  Strep by PCR     Status: None   Collection Time: 06/06/20  5:45 AM   Specimen: Throat; Sterile Swab  Result Value Ref Range Status   Group A Strep by PCR NOT DETECTED NOT DETECTED Final    Comment: Performed at [facility], [place] [address], [place], [place] [address]     Time coordinating discharge: Over 30 minutes  SIGNED:   Darliss Cheney, MD  [facility] 06/09/2020, 9:37 AM  If 7PM-7AM, please contact night-coverage [email or web address]

## 2020-06-16 ENCOUNTER — Ambulatory Visit: Payer: 59 | Admitting: Endocrinology

## 2020-06-29 ENCOUNTER — Other Ambulatory Visit: Payer: Self-pay

## 2020-06-29 ENCOUNTER — Encounter: Payer: Self-pay | Admitting: Plastic Surgery

## 2020-06-29 ENCOUNTER — Ambulatory Visit (INDEPENDENT_AMBULATORY_CARE_PROVIDER_SITE_OTHER): Payer: Self-pay | Admitting: Plastic Surgery

## 2020-06-29 VITALS — BP 134/83 | HR 110 | Ht 64.0 in | Wt 155.0 lb

## 2020-06-29 DIAGNOSIS — Z411 Encounter for cosmetic surgery: Secondary | ICD-10-CM

## 2020-06-29 NOTE — Progress Notes (Signed)
   Referring Provider Elenore Paddy, NP [place],  [place] [address]   CC:  Chief Complaint  Patient presents with  . consult      Shelley Cunningham is an 55 y.o. female.  HPI: Patient presents with a chronic left earlobe tear.  She had 3 earrings in that ear that have all torn through.  Its been present for years and she wants to see if it can be fixed.  Allergies  Allergen Reactions  . Flagyl [Metronidazole] Nausea And Vomiting    Outpatient Encounter Medications as of 06/29/2020  Medication Sig  . amLODipine (NORVASC) 5 MG tablet Take 1 tablet (5 mg total) by mouth daily.  Marland Kitchen albuterol (VENTOLIN HFA) 108 (90 Base) MCG/ACT inhaler Inhale 2 puffs into the lungs every 4 (four) hours as needed for wheezing or shortness of breath. (Patient not taking: Reported on 06/29/2020)  . ondansetron (ZOFRAN-ODT) 4 MG disintegrating tablet Take 1 tablet (4 mg total) by mouth every 8 (eight) hours as needed for nausea or vomiting.  . pantoprazole (PROTONIX) 40 MG tablet Take 1 tablet (40 mg total) by mouth daily for 7 days.   No facility-administered encounter medications on file as of 06/29/2020.     Past Medical History:  Diagnosis Date  . Anxiety   . Depression   . Hypertension   . Hyperthyroidism     No past surgical history on file.  Family History  Problem Relation Age of Onset  . Thyroid disease Neg Hx     Social History   Social History Narrative  . Not on file     Review of Systems General: Denies fevers, chills, weight loss CV: Denies chest pain, shortness of breath, palpitations  Physical Exam Vitals with BMI 06/29/2020 06/09/2020 06/08/2020  Height 5\' 4"  - -  Weight 155 lbs - -  BMI 28.41 - -  Systolic [phone]  Diastolic 83 81 70  Pulse 253 59 54    General:  No acute distress,  Alert and oriented, Non-Toxic, Normal speech and affect Examination throat shows 3 tears along the earlobe extending up towards the helical rim of the left ear.  These are  chronic with no other surrounding abnormalities.  Assessment/Plan Patient has a left ear that is been torn through with 3 splints.  I recommended doing that to splits on either side at 1 time followed by the central split 2 weeks later because otherwise the central skin pieces would be quite thin if they were cut from both sides at the same time.  She is in agreement with this and will plan to get it scheduled.  I described the procedure to her in detail including the risks include bleeding, infection, damage to surrounding structures and need for additional procedures.  All of her questions were answered.  Cindra Presume 06/29/2020, 2:53 PM

## 2020-06-30 ENCOUNTER — Other Ambulatory Visit: Payer: Self-pay | Admitting: Surgery

## 2020-06-30 ENCOUNTER — Ambulatory Visit (INDEPENDENT_AMBULATORY_CARE_PROVIDER_SITE_OTHER): Payer: 59 | Admitting: Endocrinology

## 2020-06-30 VITALS — BP 160/90 | HR 61 | Ht 64.0 in | Wt 157.2 lb

## 2020-06-30 DIAGNOSIS — E0590 Thyrotoxicosis, unspecified without thyrotoxic crisis or storm: Secondary | ICD-10-CM

## 2020-06-30 DIAGNOSIS — D369 Benign neoplasm, unspecified site: Secondary | ICD-10-CM

## 2020-06-30 LAB — TSH: TSH: 0.02 u[IU]/mL — ABNORMAL LOW (ref 0.35–4.50)

## 2020-06-30 LAB — T4, FREE: Free T4: 3.35 ng/dL — ABNORMAL HIGH (ref 0.60–1.60)

## 2020-06-30 NOTE — Progress Notes (Signed)
Subjective:    Patient ID: Shelley Cunningham, female    DOB: 1965-09-10, 55 y.o.   MRN: [id number]  HPI Pt returns for f/u of hyperthyroidism (dx'ed 2015, when she lived in [place]; She says she had thyroid storm in [place] in 2019; She had thyroid bx in [facility] in 2021; She took RAI 02/21/20).  She resumed the tapazole, but she sometimes misses.  She was last seen here 2/22, but did not ret for f/u 3/22 as advised.  She was in hosp 4/22 for neck swelling.  These sxs are improved but not resolved.  Tapazole was stopped in hospital.   Past Medical History:  Diagnosis Date  . Anxiety   . Depression   . Hypertension   . Hyperthyroidism     No past surgical history on file.  Social History   Socioeconomic History  . Marital status: Single    Spouse name: Not on file  . Number of children: Not on file  . Years of education: Not on file  . Highest education level: Not on file  Occupational History  . Not on file  Tobacco Use  . Smoking status: Never Smoker  . Smokeless tobacco: Never Used  Substance and Sexual Activity  . Alcohol use: Not on file  . Drug use: Yes    Types: Marijuana  . Sexual activity: Not on file  Other Topics Concern  . Not on file  Social History Narrative  . Not on file   Social Determinants of Health   Financial Resource Strain: Not on file  Food Insecurity: Not on file  Transportation Needs: Not on file  Physical Activity: Not on file  Stress: Not on file  Social Connections: Not on file  Intimate Partner Violence: Not on file    Current Outpatient Medications on File Prior to Visit  Medication Sig Dispense Refill  . albuterol (VENTOLIN HFA) 108 (90 Base) MCG/ACT inhaler Inhale 2 puffs into the lungs every 4 (four) hours as needed for wheezing or shortness of breath.    Marland Kitchen amLODipine (NORVASC) 5 MG tablet Take 1 tablet (5 mg total) by mouth daily. 30 tablet 0  . ondansetron (ZOFRAN-ODT) 4 MG disintegrating tablet Take 1 tablet (4 mg total) by mouth  every 8 (eight) hours as needed for nausea or vomiting. (Patient not taking: Reported on 06/30/2020) 20 tablet 0  . pantoprazole (PROTONIX) 40 MG tablet Take 1 tablet (40 mg total) by mouth daily for 7 days. 7 tablet 0   No current facility-administered medications on file prior to visit.    Allergies  Allergen Reactions  . Flagyl [Metronidazole] Nausea And Vomiting    Family History  Problem Relation Age of Onset  . Thyroid disease Neg Hx     BP (!) 160/90 (BP Location: Right Arm, Patient Position: Sitting, Cuff Size: Normal)   Pulse 61   Ht 5' 4"  (1.626 m)   Wt 157 lb 3.2 oz (71.3 kg)   SpO2 98%   BMI 26.98 kg/m    Review of Systems     Objective:   Physical Exam VITAL SIGNS:  See vs page GENERAL: no distress NECK: thyroid is 5-10 times normal size, with irreg surface, but no palpable nodule.  .    Lab Results  Component Value Date   TSH 0.02 (L) 06/30/2020   T4TOTAL 2.43 06/22/2019       Assessment & Plan:  Hyperthyroidism: uncontrolled.  I offered to rx tapazole while full effect of RAI happens.

## 2020-06-30 NOTE — Patient Instructions (Addendum)
Blood tests are requested for you today.  We'll let you know about the results.  Based on the results, you may need a thyroid hormone pill.  If so, this might reduce the swelling you have.   Please come back for a follow-up appointment in 2 months.

## 2020-07-05 ENCOUNTER — Telehealth: Payer: Self-pay | Admitting: Endocrinology

## 2020-07-05 NOTE — Telephone Encounter (Signed)
Patient requests to be called at ph# [phone] to discuss lab results/message that were sent to :Patient's MyChart  AND per MyChart message Patient requests new RX for the following:  MEDICATION: Methimazole  PHARMACY:   [facility], [place] Phone:  [phone]  Fax:  [phone]      HAS THE PATIENT CONTACTED [facility]? No   IS THIS A 90 DAY SUPPLY : Yes  IS PATIENT OUT OF MEDICATION: N/A  IF NOT; HOW MUCH IS LEFT: N/A  LAST APPOINTMENT DATE: @5 /20/2022  NEXT APPOINTMENT DATE:@7 /29/2022  DO WE HAVE YOUR PERMISSION TO LEAVE A DETAILED MESSAGE?: Yes  OTHER COMMENTS:    **Let patient know to contact pharmacy at the end of the day to make sure medication is ready. **  ** Please notify patient to allow 48-72 hours to process**  **Encourage patient to contact the pharmacy for refills or they can request refills through [facility]**

## 2020-07-05 NOTE — Telephone Encounter (Signed)
Message sent thru MyChart 

## 2020-07-05 NOTE — Telephone Encounter (Signed)
Pt is ok will doing the iodine pill.

## 2020-07-05 NOTE — Telephone Encounter (Signed)
Ok, but we should give it more time, just to make sure the thyroid has not come down.  If you want, we can move your next appt to late June--just let us know.

## 2020-07-05 NOTE — Telephone Encounter (Signed)
If you need another Radioactive iodine treatment pill.  There is a 90% change it would help.

## 2020-07-05 NOTE — Telephone Encounter (Signed)
Spoke with pt and she stated that she may need to do the iodine pill again from what you were saying and her question to you is that if she takes the pill again will it help her thyroids bc she does not want to keep taking the pill if it is not going to help her situation.  She is also needing a new  rx for the methimazole bc she stated that she took her last one this morning.  Please Advise.

## 2020-07-14 ENCOUNTER — Ambulatory Visit
Admission: RE | Admit: 2020-07-14 | Discharge: 2020-07-14 | Disposition: A | Discharge status: Home / Self Care | Payer: 59 | Source: Ambulatory Visit | Attending: Surgery | Admitting: Surgery

## 2020-07-14 DIAGNOSIS — D369 Benign neoplasm, unspecified site: Secondary | ICD-10-CM

## 2020-07-14 IMAGING — MR MR BREAST BILAT WO/W CM
7 of 9 series · 31 of 48 positions shown · IV contrast (7 ml gadavist)
Comparison: [DATE] and previous MRI exam [DATE] and
[DATE]

CLINICAL DATA: History of multiple papillomas. Sore and tender
breasts, associated heaviness for 2 weeks. History of multiple
bilateral biopsies showing benign papillomas. Patient sees Dr.
RUWANKUMRA for management of papillomatosis.

LABS:  None obtained at the time of imaging.
EXAM:
BILATERAL BREAST MRI WITH AND WITHOUT CONTRAST
TECHNIQUE: Multiplanar, multisequence MR images of both breasts were obtained
prior to and following the intravenous administration of 7 ml of
Gadavist

[Series 2: t2_tirm_tra ipat (a-p) · axial · 3.0mm · 0.78mm/px · 1 of 55 slices shown]
[im 1/55]
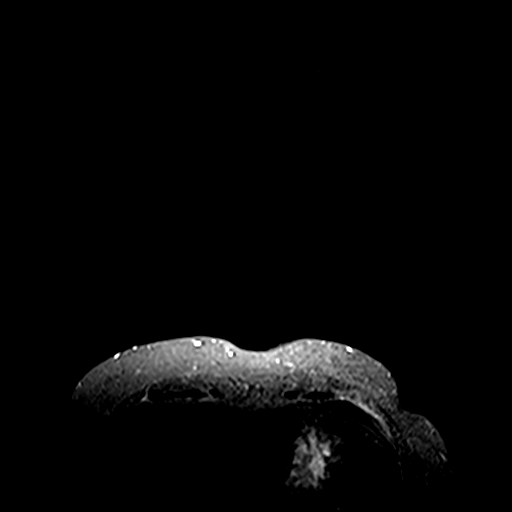

[Series 3: fl3d pre-cm no · axial · non-contrast · 1.2mm · 1.04mm/px · z∈[-86,+85]mm · 5 of 144 slices shown]
[im 1/144]
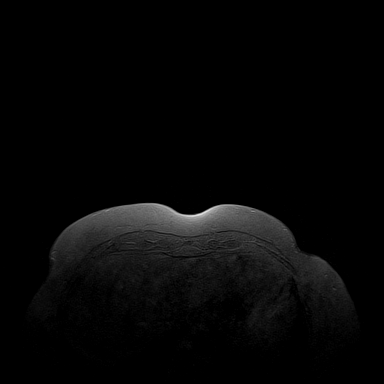
[im 36/144]
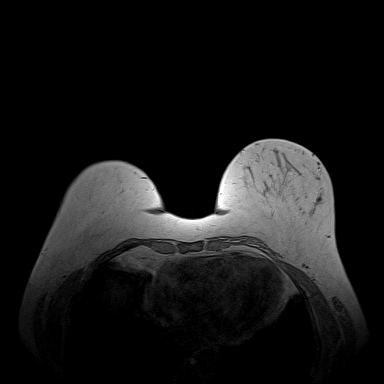
[im 72/144]
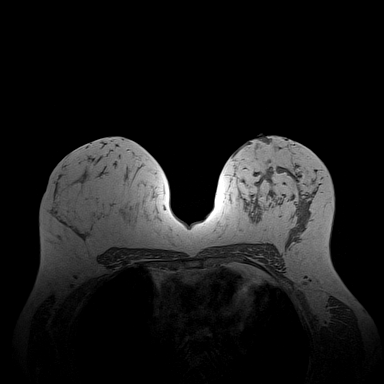
[im 108/144]
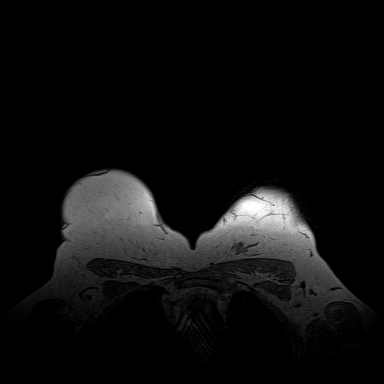
[im 144/144]
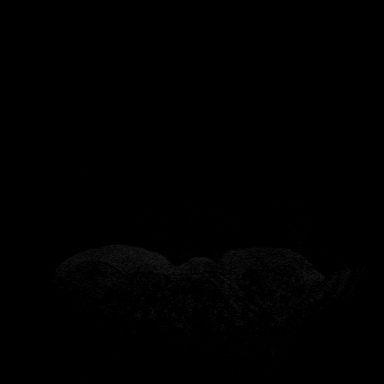

[Series 4: fl3d pre-cm · axial · non-contrast · 1.2mm · 1.04mm/px · z∈[-86,+85]mm · 6 of 144 slices shown]
[im 1/144]
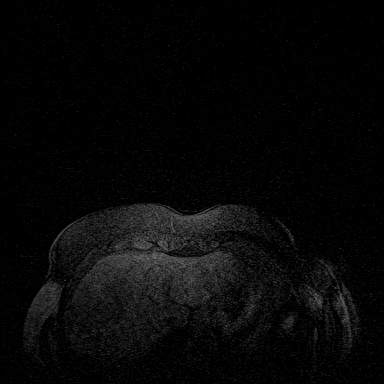
[im 29/144]
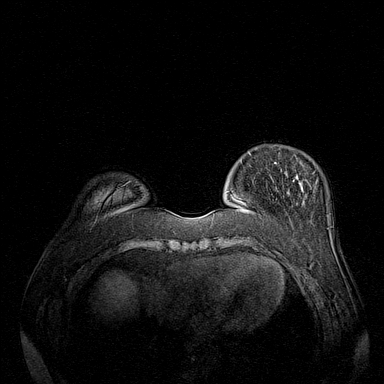
[im 58/144]
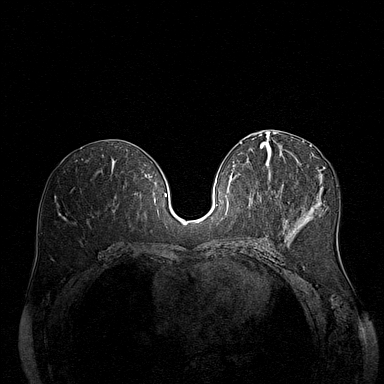
[im 86/144]
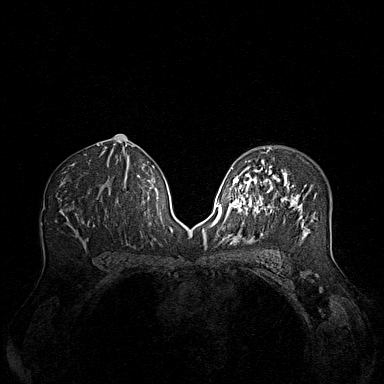
[im 115/144]
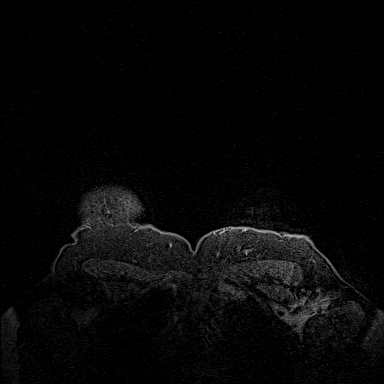
[im 144/144]
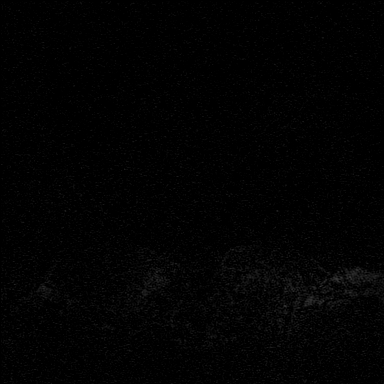

[Series 5: fl3d post-cm 20 · axial · 1.2mm · 1.04mm/px · z∈[-86,+85]mm · 6 of 144 slices shown (1 of 2)]
[im 1/144]
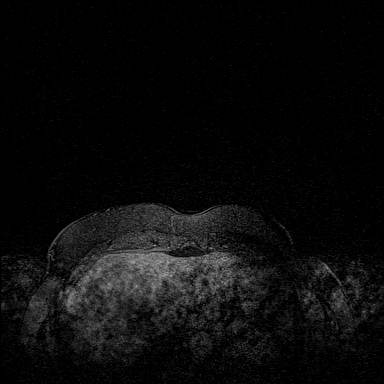
[im 29/144]
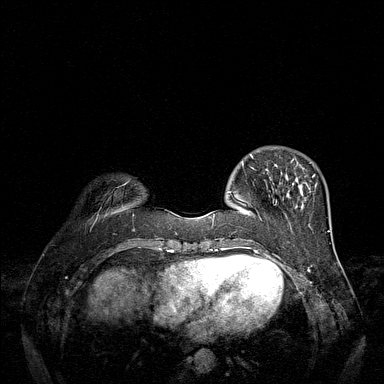
[im 58/144]
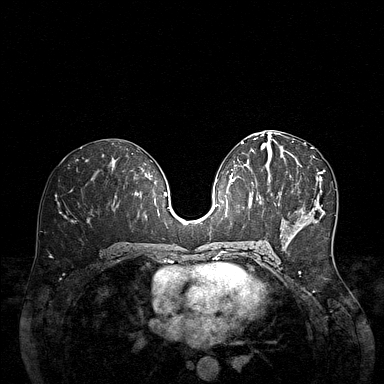
[im 86/144]
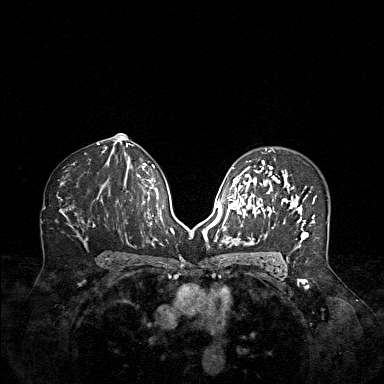
[im 115/144]
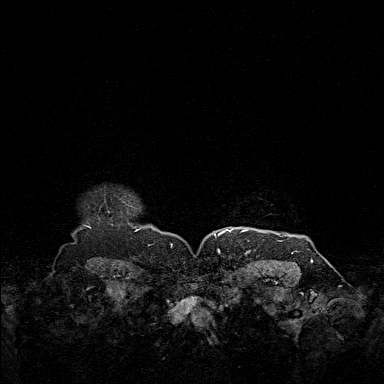
[im 144/144]
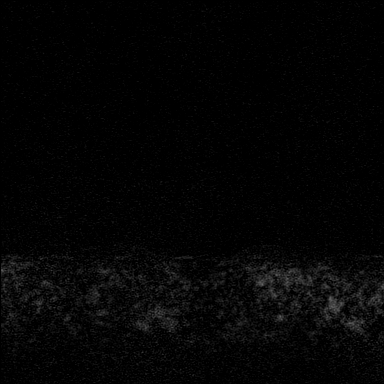

[Series 6: fl3d post-cm 20 · axial · 1.2mm · 1.04mm/px · z∈[-86,+85]mm · 6 of 144 slices shown (2 of 2)]
[im 1/144]
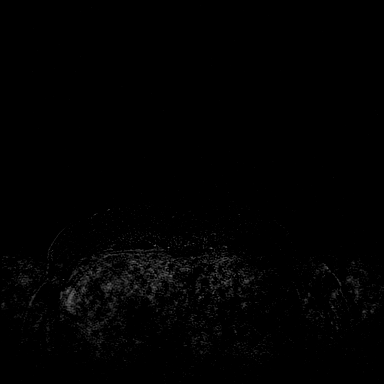
[im 29/144]
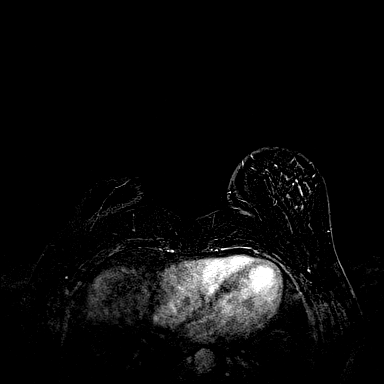
[im 58/144]
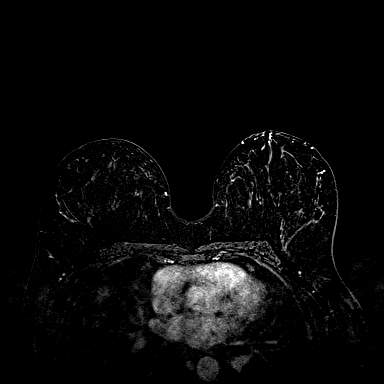
[im 86/144]
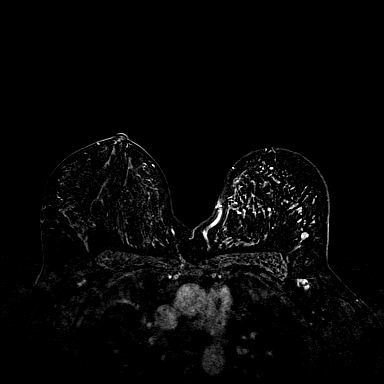
[im 115/144]
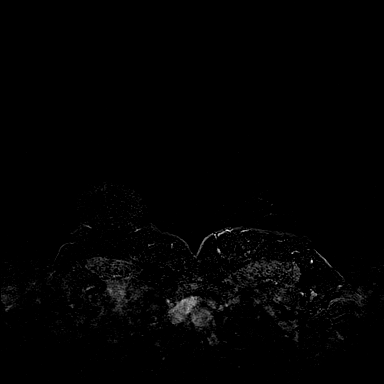
[im 144/144]
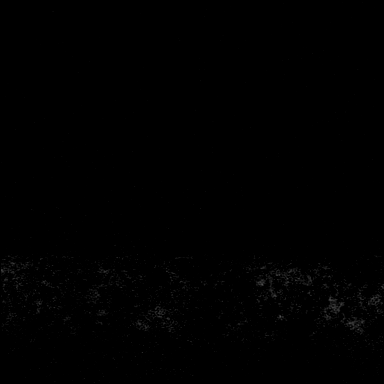

[Series 8: fl3d post-cm 3min · axial · 1.2mm · 1.04mm/px · z∈[-86,+85]mm · 6 of 144 slices shown]
[im 1/144]
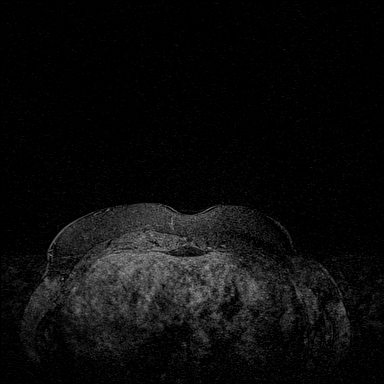
[im 29/144]
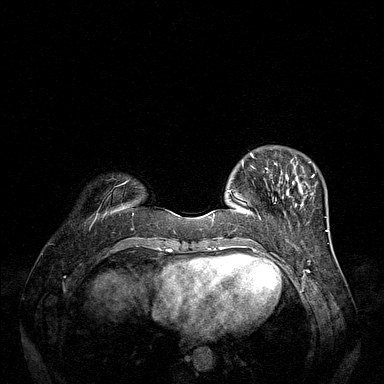
[im 58/144]
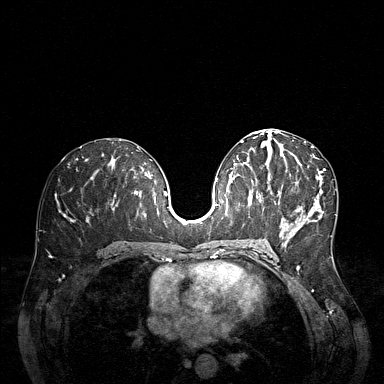
[im 86/144]
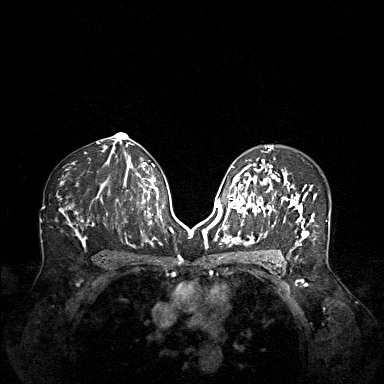
[im 115/144]
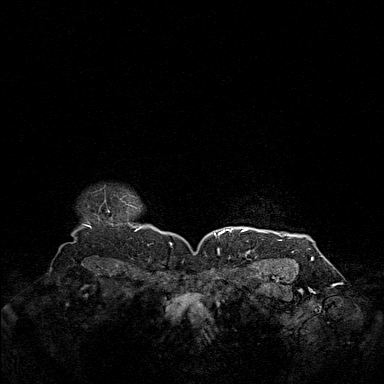
[im 144/144]
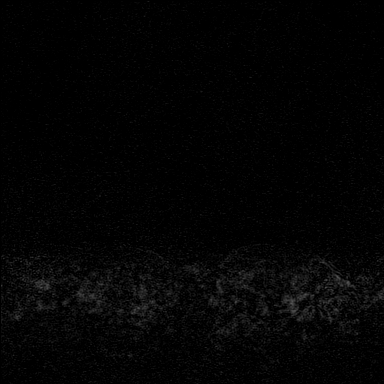

[Series 9: fl3d post-cm 3min_sub · axial · 1.2mm · 1.04mm/px · 1 of 144 slices shown]
[im 1/144]
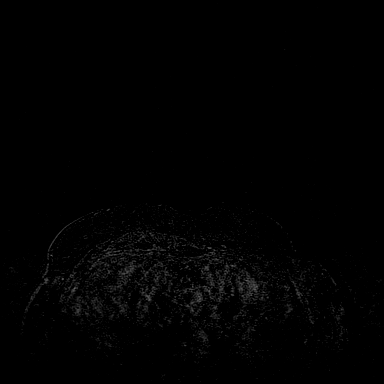

[31 of 48 positions shown; findings below may reference images not displayed]

Three-dimensional MR images were rendered by post-processing of the
original MR data on an independent workstation. The
three-dimensional MR images were interpreted, and findings are
reported in the following complete MRI report for this study. Three
dimensional images were evaluated at the independent interpreting
workstation using the DynaCAD thin client.
FINDINGS: Breast composition: c. Heterogeneous fibroglandular tissue.

Background parenchymal enhancement: Mild

Right breast: No mass or abnormal enhancement. There is artifact
from 2 tissue marker clips in the RIGHT breast following biopsies of
benign papillomas. There is no significant enhancement in either of
these locations. There has been significant improvement in the
appearance of duct dilatation in the RIGHT breast. No new or
suspicious findings in the RIGHT breast.

Left breast: Four tissue marker clips are identified in the LEFT
breast following previous benign biopsies. Tissue marker clip and
associated small enhancing mass measuring 8 millimeters is noted in
the MEDIAL mid aspect of the LEFT breast, mid to posterior depth,
best seen on image 77 of series 12 at the site of previous biopsy
proven papilloma. Other tissue marker clips show minimal associated
enhancement. Numerous 2-5 millimeter enhancing masses are identified
throughout the LEFT breast, consistent with known papillomatosis. In
general, these are less prominent compared with the previous study.
There is persistent duct dilatation in the central aspect of the
LEFT breast, slightly improved compared with prior studies. No new
or suspicious mass identified.

Lymph nodes: No abnormal appearing lymph nodes.

Ancillary findings:  None.
IMPRESSION: 1. No suspicious enhancement in the RIGHT breast.
2. Numerous small enhancing masses throughout the LEFT breast,
generally improved compared to prior study and consistent with known
papillomatosis.
3. Improvement in duct dilatation bilaterally.

RECOMMENDATION:
Recommend continued annual mammography and MRI, given the patient's
history of papillomatosis. Next diagnostic mammogram is recommended
in [DATE].

BI-RADS CATEGORY  2: Benign.

## 2020-07-14 MED ORDER — GADOBUTROL 1 MMOL/ML IV SOLN
7.0000 mL | Freq: Once | INTRAVENOUS | Status: AC | PRN
  Administered 2020-07-14: 7 mL via INTRAVENOUS

## 2020-08-17 ENCOUNTER — Encounter: Payer: Self-pay | Admitting: Plastic Surgery

## 2020-08-17 ENCOUNTER — Ambulatory Visit (INDEPENDENT_AMBULATORY_CARE_PROVIDER_SITE_OTHER): Payer: Self-pay | Admitting: Plastic Surgery

## 2020-08-17 ENCOUNTER — Other Ambulatory Visit: Payer: Self-pay

## 2020-08-17 VITALS — BP 170/86 | HR 71 | Ht 64.0 in | Wt 162.0 lb

## 2020-08-17 DIAGNOSIS — Z411 Encounter for cosmetic surgery: Secondary | ICD-10-CM

## 2020-08-17 NOTE — Progress Notes (Signed)
Operative Note   DATE OF OPERATION: 08/17/2020  LOCATION:    SURGICAL DEPARTMENT: Plastic Surgery  PREOPERATIVE DIAGNOSES: Left earlobe tears x3  POSTOPERATIVE DIAGNOSES:  same  PROCEDURE:  Repair of left earlobe tears x2  SURGEON: Talmadge Coventry, MD  ANESTHESIA:  Local  COMPLICATIONS: None.   INDICATIONS FOR PROCEDURE:  The patient, Shelley Cunningham is a 55 y.o. female born on [birth year], is here for treatment of left earlobe tears MRN: [id number]  CONSENT:  Informed consent was obtained directly from the patient. Risks, benefits and alternatives were fully discussed. Specific risks including but not limited to bleeding, infection, hematoma, seroma, scarring, pain, infection, wound healing problems, and need for further surgery were all discussed. The patient did have an ample opportunity to have questions answered to satisfaction.   DESCRIPTION OF PROCEDURE:  Local anesthesia was administered. The patient's operative site was prepped and draped in a sterile fashion. A time out was performed and all information was confirmed to be correct.  The inferior and superior splits of the earlobe were excised with an 11 blade.  Hemostasis was obtained.  Repair was then done with a combination of mattress and interrupted 5-0 Vicryl sutures.  The central split was left alone at this point and I am planning to address that in a delayed fashion given how close it was to the other tears.  This gave a nice on table result.  The patient tolerated the procedure well.  There were no complications.

## 2020-08-31 ENCOUNTER — Other Ambulatory Visit: Payer: Self-pay

## 2020-08-31 ENCOUNTER — Ambulatory Visit: Payer: 59 | Admitting: Plastic Surgery

## 2020-08-31 ENCOUNTER — Encounter: Payer: Self-pay | Admitting: Plastic Surgery

## 2020-08-31 ENCOUNTER — Ambulatory Visit (INDEPENDENT_AMBULATORY_CARE_PROVIDER_SITE_OTHER): Payer: Self-pay | Admitting: Plastic Surgery

## 2020-08-31 VITALS — BP 148/84 | HR 73 | Ht 64.0 in | Wt 162.8 lb

## 2020-08-31 DIAGNOSIS — Z411 Encounter for cosmetic surgery: Secondary | ICD-10-CM

## 2020-08-31 NOTE — Progress Notes (Signed)
Operative Note   DATE OF OPERATION: 08/31/2020  LOCATION:    SURGICAL DEPARTMENT: Plastic Surgery  PREOPERATIVE DIAGNOSES: Split left earlobe  POSTOPERATIVE DIAGNOSES:  same  PROCEDURE:  Left earlobe repair  SURGEON: Talmadge Coventry, MD  ANESTHESIA:  Local  COMPLICATIONS: None.   INDICATIONS FOR PROCEDURE:  The patient, Shelley Cunningham is a 55 y.o. female born on [birth year], is here for treatment of split earlobe.  She had 3 splits that were adjacent to each other.  2 weeks ago I repaired the superior and inferior ones.  She presents today for me to repair the one that was between the 2.  She is healed well from the prior repairs. MRN: [id number]  CONSENT:  Informed consent was obtained directly from the patient. Risks, benefits and alternatives were fully discussed. Specific risks including but not limited to bleeding, infection, hematoma, seroma, scarring, pain, infection, wound healing problems, and need for further surgery were all discussed. The patient did have an ample opportunity to have questions answered to satisfaction.   DESCRIPTION OF PROCEDURE:  Local anesthesia was administered. The patient's operative site was prepped and draped in a sterile fashion. A time out was performed and all information was confirmed to be correct.  The remaining split was excised with an 11 blade.  Hemostasis was obtained.  The plate was then repaired with combination of interrupted and mattress 5-0 Monocryl sutures.  She has a good on table result.  The patient tolerated the procedure well.  There were no complications.

## 2020-09-08 ENCOUNTER — Ambulatory Visit: Payer: 59 | Admitting: Endocrinology

## 2020-09-14 ENCOUNTER — Other Ambulatory Visit: Payer: Self-pay

## 2020-09-14 ENCOUNTER — Ambulatory Visit (INDEPENDENT_AMBULATORY_CARE_PROVIDER_SITE_OTHER): Payer: Self-pay | Admitting: Plastic Surgery

## 2020-09-14 DIAGNOSIS — Z411 Encounter for cosmetic surgery: Secondary | ICD-10-CM

## 2020-09-14 NOTE — Progress Notes (Signed)
Patient presents postop from repair of left earlobe.  This is doing great.  Sutures were removed.  We will plan to give this a couple months before we piercing it.  All of her questions were answered.

## 2020-10-06 ENCOUNTER — Ambulatory Visit: Payer: 59 | Admitting: Endocrinology

## 2020-11-01 ENCOUNTER — Ambulatory Visit: Payer: 59 | Admitting: Plastic Surgery

## 2020-11-03 ENCOUNTER — Ambulatory Visit: Payer: 59 | Admitting: Endocrinology

## 2020-12-06 ENCOUNTER — Ambulatory Visit: Payer: Self-pay | Admitting: Plastic Surgery
